# Patient Record
Sex: Male | Born: 1955 | Race: White | Hispanic: No | Smoking: Current every day smoker
Health system: Southern US, Community
[De-identification: ages and names within clinical notes are randomized; demographics above are authoritative.]

## PROBLEM LIST (undated history)

## (undated) DIAGNOSIS — M51369 Other intervertebral disc degeneration, lumbar region without mention of lumbar back pain or lower extremity pain: Secondary | ICD-10-CM

## (undated) DIAGNOSIS — E785 Hyperlipidemia, unspecified: Secondary | ICD-10-CM

## (undated) DIAGNOSIS — H9319 Tinnitus, unspecified ear: Secondary | ICD-10-CM

## (undated) DIAGNOSIS — H9190 Unspecified hearing loss, unspecified ear: Secondary | ICD-10-CM

## (undated) DIAGNOSIS — M5136 Other intervertebral disc degeneration, lumbar region: Secondary | ICD-10-CM

## (undated) DIAGNOSIS — F17200 Nicotine dependence, unspecified, uncomplicated: Secondary | ICD-10-CM

## (undated) DIAGNOSIS — I639 Cerebral infarction, unspecified: Secondary | ICD-10-CM

## (undated) DIAGNOSIS — I10 Essential (primary) hypertension: Secondary | ICD-10-CM

## (undated) HISTORY — PX: PYLOROPLASTY: SHX418

## (undated) HISTORY — PX: WISDOM TOOTH EXTRACTION: SHX21

## (undated) HISTORY — DX: Other intervertebral disc degeneration, lumbar region: M51.36

## (undated) HISTORY — DX: Essential (primary) hypertension: I10

## (undated) HISTORY — PX: HAND SURGERY: SHX662

## (undated) HISTORY — DX: Hyperlipidemia, unspecified: E78.5

## (undated) HISTORY — DX: Other intervertebral disc degeneration, lumbar region without mention of lumbar back pain or lower extremity pain: M51.369

## (undated) HISTORY — DX: Nicotine dependence, unspecified, uncomplicated: F17.200

## (undated) HISTORY — PX: COLONOSCOPY W/ POLYPECTOMY: SHX1380

## (undated) HISTORY — PX: TONSILLECTOMY: SUR1361

---

## 2009-03-28 ENCOUNTER — Emergency Department (HOSPITAL_COMMUNITY): Admission: EM | Admit: 2009-03-28 | Discharge: 2009-03-28 | Payer: Self-pay | Admitting: Emergency Medicine

## 2010-01-06 ENCOUNTER — Ambulatory Visit (HOSPITAL_COMMUNITY)
Admission: RE | Admit: 2010-01-06 | Discharge: 2010-01-06 | Payer: Self-pay | Admitting: Physical Medicine & Rehabilitation

## 2010-12-24 ENCOUNTER — Other Ambulatory Visit: Payer: Self-pay | Admitting: Family Medicine

## 2010-12-24 DIAGNOSIS — N644 Mastodynia: Secondary | ICD-10-CM

## 2010-12-27 ENCOUNTER — Ambulatory Visit
Admission: RE | Admit: 2010-12-27 | Discharge: 2010-12-27 | Disposition: A | Discharge status: Home / Self Care | Payer: PRIVATE HEALTH INSURANCE | Source: Ambulatory Visit | Attending: Family Medicine | Admitting: Family Medicine

## 2010-12-27 DIAGNOSIS — N644 Mastodynia: Secondary | ICD-10-CM

## 2011-07-21 ENCOUNTER — Other Ambulatory Visit: Payer: Self-pay | Admitting: Internal Medicine

## 2011-07-21 ENCOUNTER — Ambulatory Visit
Admission: RE | Admit: 2011-07-21 | Discharge: 2011-07-21 | Disposition: A | Discharge status: Home / Self Care | Payer: PRIVATE HEALTH INSURANCE | Source: Ambulatory Visit | Attending: Internal Medicine | Admitting: Internal Medicine

## 2011-07-21 ENCOUNTER — Ambulatory Visit
Admission: RE | Admit: 2011-07-21 | Discharge: 2011-07-21 | Disposition: A | Discharge status: Home / Self Care | Payer: Worker's Compensation | Source: Ambulatory Visit | Attending: Internal Medicine | Admitting: Internal Medicine

## 2011-07-21 DIAGNOSIS — T148XXA Other injury of unspecified body region, initial encounter: Secondary | ICD-10-CM

## 2012-07-28 ENCOUNTER — Telehealth: Payer: Self-pay

## 2012-07-28 NOTE — Telephone Encounter (Signed)
PT WOULD LIKE [place] TO MAIL HIM A COPY OF HIS RECORDS WHEN HE WAS SEEN ON W/C. PLEASE CALL [phone] TO LET HIM KNOW WHEN WE PUT IT IN THE MAIL PT WAS ALSO TOLD IT COULD TAKE UP TO 72HRS.

## 2012-07-28 NOTE — Telephone Encounter (Signed)
Printed out phone message due to patient only having a paper chart. °

## 2013-04-07 ENCOUNTER — Other Ambulatory Visit: Payer: Self-pay | Admitting: Physician Assistant

## 2013-04-07 NOTE — Telephone Encounter (Signed)
Medication refilled per protocol.Patient needs to be seen before any further refills.  Called pt left mess to schedule appt

## 2013-04-08 ENCOUNTER — Other Ambulatory Visit: Payer: Self-pay | Admitting: Family Medicine

## 2013-04-08 DIAGNOSIS — Z125 Encounter for screening for malignant neoplasm of prostate: Secondary | ICD-10-CM

## 2013-04-08 DIAGNOSIS — E785 Hyperlipidemia, unspecified: Secondary | ICD-10-CM

## 2013-04-08 DIAGNOSIS — Z79899 Other long term (current) drug therapy: Secondary | ICD-10-CM

## 2013-04-11 ENCOUNTER — Other Ambulatory Visit (INDEPENDENT_AMBULATORY_CARE_PROVIDER_SITE_OTHER): Payer: 59

## 2013-04-11 DIAGNOSIS — Z79899 Other long term (current) drug therapy: Secondary | ICD-10-CM

## 2013-04-11 DIAGNOSIS — Z125 Encounter for screening for malignant neoplasm of prostate: Secondary | ICD-10-CM

## 2013-04-11 DIAGNOSIS — E785 Hyperlipidemia, unspecified: Secondary | ICD-10-CM

## 2013-04-11 LAB — COMPLETE METABOLIC PANEL WITH GFR
ALT: 20 U/L (ref 0–53)
AST: 20 U/L (ref 0–37)
CO2: 26 mEq/L (ref 19–32)
Chloride: 105 mEq/L (ref 96–112)
Creat: 0.9 mg/dL (ref 0.50–1.35)
GFR, Est African American: >89 mL/min
GFR, Est Non African American: >89 mL/min
Potassium: 5.4 mEq/L — ABNORMAL HIGH (ref 3.5–5.3)
Total Bilirubin: 0.7 mg/dL (ref 0.3–1.2)

## 2013-04-11 LAB — CBC WITH DIFFERENTIAL/PLATELET
Eosinophils Relative: 5 % (ref 0–5)
HCT: 46.5 % (ref 39.0–52.0)
MCH: 34.3 pg — ABNORMAL HIGH (ref 26.0–34.0)
MCHC: 34.6 g/dL (ref 30.0–36.0)
MCV: 98.9 fL (ref 78.0–100.0)
RBC: 4.7 MIL/uL (ref 4.22–5.81)
RDW: 13.6 % (ref 11.5–15.5)

## 2013-04-11 LAB — LIPID PANEL
Total CHOL/HDL Ratio: 2.8 Ratio
Triglycerides: 92 mg/dL (ref <150)
VLDL: 18 mg/dL (ref 0–40)

## 2013-04-18 ENCOUNTER — Encounter: Payer: Self-pay | Admitting: Family Medicine

## 2013-04-18 ENCOUNTER — Ambulatory Visit (INDEPENDENT_AMBULATORY_CARE_PROVIDER_SITE_OTHER): Payer: 59 | Admitting: Family Medicine

## 2013-04-18 VITALS — BP 110/72 | HR 78 | Temp 98.0°F | Resp 16 | Wt 158.0 lb

## 2013-04-18 DIAGNOSIS — M5136 Other intervertebral disc degeneration, lumbar region: Secondary | ICD-10-CM | POA: Insufficient documentation

## 2013-04-18 DIAGNOSIS — F17200 Nicotine dependence, unspecified, uncomplicated: Secondary | ICD-10-CM | POA: Insufficient documentation

## 2013-04-18 DIAGNOSIS — E785 Hyperlipidemia, unspecified: Secondary | ICD-10-CM

## 2013-04-18 MED ORDER — MELOXICAM 15 MG PO TABS: 15.0000 mg | ORAL_TABLET | Freq: Every day | ORAL | Status: DC

## 2013-04-18 MED ORDER — SIMVASTATIN 40 MG PO TABS: 40.0000 mg | ORAL_TABLET | Freq: Every day | ORAL | Status: DC

## 2013-04-18 NOTE — Progress Notes (Signed)
Subjective:    Patient ID: Jacob Yoder, male    DOB: 1956/07/08, 57 y.o.   MRN: [id number]  HPI Patient is a smoker and is here to followup on his hyperlipidemia. He is currently taking simvastatin 40 mg by mouth daily. He is a significant family history of coronary artery disease. His goal LDL is less than 130 due to his elevated HDL.  He denies myalgia or right upper quadrant pain. He states he still has occasional anal leakage flatulence and occasional diarrhea. His last colonoscopy was in 2007. He declines a colonoscopy at the present time to evaluate this further.  Appointment on 04/11/2013  Component Date Value Range Status  . Sodium 04/11/2013 139  135 - 145 mEq/L Final  . Potassium 04/11/2013 5.4* 3.5 - 5.3 mEq/L Final   No visible hemolysis.  . Chloride 04/11/2013 105  96 - 112 mEq/L Final  . CO2 04/11/2013 26  19 - 32 mEq/L Final  . Glucose, Bld 04/11/2013 104* 70 - 99 mg/dL Final  . BUN 08/65/7846 26* 6 - 23 mg/dL Final  . Creat 96/29/5284 0.90  0.50 - 1.35 mg/dL Final  . Total Bilirubin 04/11/2013 0.7  0.3 - 1.2 mg/dL Final  . Alkaline Phosphatase 04/11/2013 69  39 - 117 U/L Final  . AST 04/11/2013 20  0 - 37 U/L Final  . ALT 04/11/2013 20  0 - 53 U/L Final  . Total Protein 04/11/2013 7.1  6.0 - 8.3 g/dL Final  . Albumin 13/24/4010 4.8  3.5 - 5.2 g/dL Final  . Calcium 27/25/3664 10.1  8.4 - 10.5 mg/dL Final  . GFR, Est African American 04/11/2013 >89   Final  . GFR, Est Non African American 04/11/2013 >89   Final   Comment:                            The estimated GFR is a calculation valid for adults (>=2 years old)                          that uses the CKD-EPI algorithm to adjust for age and sex. It is                            not to be used for children, pregnant women, hospitalized patients,                             patients on dialysis, or with rapidly changing kidney function.                          According to the NKDEP, eGFR >89 is normal, 60-89 shows  mild                          impairment, 30-59 shows moderate impairment, 15-29 shows severe                          impairment and <15 is ESRD.                             Marland Kitchen Cholesterol 04/11/2013 190  0 - 200 mg/dL Final   Comment: ATP III Classification:                                <  200        mg/dL        Desirable                               200 - 239     mg/dL        Borderline High                               >= 240        mg/dL        High                             . Triglycerides 04/11/2013 92  <150 mg/dL Final  . HDL 16/08/9603 69  >39 mg/dL Final  . Total CHOL/HDL Ratio 04/11/2013 2.8   Final  . VLDL 04/11/2013 18  0 - 40 mg/dL Final  . LDL Cholesterol 04/11/2013 103* 0 - 99 mg/dL Final   Comment:                            Total Cholesterol/HDL Ratio:CHD Risk                                                 Coronary Heart Disease Risk Table                                                                 Men       Women                                   1/2 Average Risk              3.4        3.3                                       Average Risk              5.0        4.4                                    2X Average Risk              9.6        7.1                                    3X Average Risk             23.4       11.0  Use the calculated Patient Ratio above and the CHD Risk table                           to determine the patient's CHD Risk.                          ATP III Classification (LDL):                                < 100        mg/dL         Optimal                               100 - 129     mg/dL         Near or Above Optimal                               130 - 159     mg/dL         Borderline High                               160 - 189     mg/dL         High                                > 190        mg/dL         Very High                             . WBC 04/11/2013 6.6  4.0 - 10.5 K/uL Final  . RBC  04/11/2013 4.70  4.22 - 5.81 MIL/uL Final  . Hemoglobin 04/11/2013 16.1  13.0 - 17.0 g/dL Final  . HCT 40/98/1191 46.5  39.0 - 52.0 % Final  . MCV 04/11/2013 98.9  78.0 - 100.0 fL Final  . MCH 04/11/2013 34.3* 26.0 - 34.0 pg Final  . MCHC 04/11/2013 34.6  30.0 - 36.0 g/dL Final  . RDW 47/82/9562 13.6  11.5 - 15.5 % Final  . Platelets 04/11/2013 259  150 - 400 K/uL Final  . Neutrophils Relative % 04/11/2013 49  43 - 77 % Final  . Neutro Abs 04/11/2013 3.2  1.7 - 7.7 K/uL Final  . Lymphocytes Relative 04/11/2013 36  12 - 46 % Final  . Lymphs Abs 04/11/2013 2.4  0.7 - 4.0 K/uL Final  . Monocytes Relative 04/11/2013 9  3 - 12 % Final  . Monocytes Absolute 04/11/2013 0.6  0.1 - 1.0 K/uL Final  . Eosinophils Relative 04/11/2013 5  0 - 5 % Final  . Eosinophils Absolute 04/11/2013 0.3  0.0 - 0.7 K/uL Final  . Basophils Relative 04/11/2013 1  0 - 1 % Final  . Basophils Absolute 04/11/2013 0.1  0.0 - 0.1 K/uL Final  . Smear Review 04/11/2013 Criteria for review not met   Final  . PSA 04/11/2013 1.15  <=4.00 ng/mL Final   Comment: Test Methodology: ECLIA PSA (Electrochemiluminescence Immunoassay)  For PSA values from 2.5-4.0, particularly in younger men <60 years                          old, the AUA and NCCN suggest testing for % Free PSA (3515) and                          evaluation of the rate of increase in PSA (PSA velocity).   Past Medical History  Diagnosis Date  . Hyperlipidemia   . DDD (degenerative disc disease), lumbar   . Smoker    No current outpatient prescriptions on file prior to visit.   No current facility-administered medications on file prior to visit.   No Known Allergies History   Social History  . Marital Status: Divorced    Spouse Name: N/A    Number of Children: N/A  . Years of Education: N/A   Occupational History  . Not on file.   Social History Main Topics  . Smoking status: Current Every Day Smoker  -- 1.00 packs/day    Types: Cigarettes  . Smokeless tobacco: Not on file  . Alcohol Use: 1.0 oz/week    2 drink(s) per week  . Drug Use: No  . Sexually Active: Not on file   Other Topics Concern  . Not on file   Social History Narrative  . No narrative on file   Family History  Problem Relation Age of Onset  . Heart disease Father   . Heart disease Paternal Grandfather      Review of Systems  All other systems reviewed and are negative.       Objective:   Physical Exam  Vitals reviewed. Constitutional: He appears well-developed and well-nourished.  Neck: Neck supple. No thyromegaly present.  Cardiovascular: Normal rate, regular rhythm and normal heart sounds.  Exam reveals no gallop.   No murmur heard. Pulmonary/Chest: Effort normal and breath sounds normal. No respiratory distress. He has no wheezes. He has no rales.  Abdominal: Soft. Bowel sounds are normal. He exhibits no distension. There is no tenderness. There is no rebound.  Lymphadenopathy:    He has no cervical adenopathy.          Assessment & Plan:  1. HLD (hyperlipidemia) Decrease Zocor to 20 mg by mouth daily. Recheck CMP and fasting lipid panel in 6 months. I encouraged smoking cessation. I recommended colonoscopy but the patient declines at the present time. I refill his meloxicam which he takes for his degenerative disc disease in lumbar spine. He is aware of the possible risk of ulcers as well as the increased risk of cardiovascular disease.

## 2013-08-01 ENCOUNTER — Ambulatory Visit (INDEPENDENT_AMBULATORY_CARE_PROVIDER_SITE_OTHER): Payer: 59 | Admitting: Physician Assistant

## 2013-08-01 ENCOUNTER — Encounter: Payer: Self-pay | Admitting: Physician Assistant

## 2013-08-01 VITALS — BP 114/70 | HR 88 | Temp 98.2°F | Resp 20 | Ht 67.0 in | Wt 159.0 lb

## 2013-08-01 DIAGNOSIS — F17200 Nicotine dependence, unspecified, uncomplicated: Secondary | ICD-10-CM

## 2013-08-01 DIAGNOSIS — J988 Other specified respiratory disorders: Secondary | ICD-10-CM

## 2013-08-01 DIAGNOSIS — A499 Bacterial infection, unspecified: Secondary | ICD-10-CM

## 2013-08-01 MED ORDER — AZITHROMYCIN 250 MG PO TABS: ORAL_TABLET | ORAL | Status: DC

## 2013-08-01 NOTE — Progress Notes (Signed)
Patient ID: Jacob Yoder MRN: [id number], DOB: [birth year], 57 y.o. Date of Encounter: 08/01/2013, 8:57 AM    Chief Complaint:  Chief Complaint  Patient presents with  . sinus congestion, HA, cough     HPI: 57 y.o. year old white male reports that he started getting sick about 5 days ago at which time he had sneezing. Since then he has had times when his nose is runny and other times where it is stopped up and congested. Feels pressure in his sinus areas. Also has chest congestion and cough with thick phlegm. Felt much worse yesterday in general. Has had no documented fever and no chills.  Smokes one pack per day. Says he brought some patches but hasn't quite made up his mind to stop smoking and use the patches.     Home Meds: See attached medication section for any medications that were entered at today's visit. The computer does not put those onto this list.The following list is a list of meds entered prior to today's visit.   Current Outpatient Prescriptions on File Prior to Visit  Medication Sig Dispense Refill  . aspirin 81 MG tablet Take 81 mg by mouth daily.      . meloxicam (MOBIC) 15 MG tablet Take 1 tablet (15 mg total) by mouth daily.  90 tablet  3  . simvastatin (ZOCOR) 40 MG tablet Take 1 tablet (40 mg total) by mouth at bedtime.  90 tablet  3  . cholecalciferol (VITAMIN D) 1000 UNITS tablet Take 1,000 Units by mouth daily. 1/2 tab po qd       No current facility-administered medications on file prior to visit.    Allergies: No Known Allergies    Review of Systems: See HPI for pertinent ROS. All other ROS negative.    Physical Exam: Blood pressure 114/70, pulse 88, temperature 98.2 F (36.8 C), temperature source Oral, resp. rate 20, height 5\' 7"  (1.702 m), weight 159 lb (72.122 kg)., Body mass index is 24.9 kg/(m^2). General: Well-nourished well-developed white male. Appears in no acute distress. HEENT: Normocephalic, atraumatic, eyes without discharge, sclera  non-icteric, nares are without discharge. Bilateral auditory canals clear, TM's are without perforation, pearly grey and translucent with reflective cone of light bilaterally. Oral cavity moist, posterior pharynx without exudate, erythema, peritonsillar abscess. No sinus tenderness with percussion.  Neck: Supple. No thyromegaly. No lymphadenopathy. Lungs: Clear bilaterally to auscultation without wheezes, rales, or rhonchi. Breathing is unlabored. Clear. I hear no wheezes or rhonchi. Heart: Regular rhythm. No murmurs, rubs, or gallops. Msk:  Strength and tone normal for age. Extremities/Skin: Warm and dry. No rashes or suspicious lesions. Neuro: Alert and oriented X 3. Moves all extremities spontaneously. Gait is normal. CNII-XII grossly in tact. Psych:  Responds to questions appropriately with a normal affect.     ASSESSMENT AND PLAN:  57 y.o. year old male with  1. Bacterial respiratory infection Also use Mucinex DM and drink lots of clear liquids. - azithromycin (ZITHROMAX) 250 MG tablet; Day 1: Take 2 daily.  Days 2-5: Take 1 daily  Dispense: 6 tablet; Refill: 0 Complete all of the antibiotic. Use Mucinex DM and liquids as above. Follow up if symptoms worsen or do not resolve.  2. Smoker Discussed Chantix. Patient is not want to use this prescription. Concerned about adverse effects though I tried to reassure him regarding these. Encouraged him to become motivated to quit and to use the patches or other aids to assist this.   Signed, Shon Hale  [place], [place], [facility] 08/01/2013 8:57 AM

## 2013-08-19 ENCOUNTER — Telehealth: Payer: Self-pay | Admitting: Family Medicine

## 2013-08-19 MED ORDER — MELOXICAM 15 MG PO TABS: 15.0000 mg | ORAL_TABLET | Freq: Every day | ORAL | Status: DC

## 2013-08-19 NOTE — Telephone Encounter (Signed)
Patient needs his Mobic  Refilled.  [facility] home delivery

## 2013-08-19 NOTE — Addendum Note (Signed)
Addended by: Donne Anon on: 08/19/2013 02:17 PM   Modules accepted: Orders

## 2013-10-18 ENCOUNTER — Other Ambulatory Visit: Payer: 59

## 2013-10-18 DIAGNOSIS — Z79899 Other long term (current) drug therapy: Secondary | ICD-10-CM

## 2013-10-18 DIAGNOSIS — E785 Hyperlipidemia, unspecified: Secondary | ICD-10-CM

## 2013-10-18 LAB — COMPLETE METABOLIC PANEL WITH GFR
ALT: 16 U/L (ref 0–53)
AST: 18 U/L (ref 0–37)
Albumin: 4.4 g/dL (ref 3.5–5.2)
Alkaline Phosphatase: 65 U/L (ref 39–117)
Calcium: 9.7 mg/dL (ref 8.4–10.5)
Creat: 0.88 mg/dL (ref 0.50–1.35)
Sodium: 137 mEq/L (ref 135–145)

## 2013-10-18 LAB — LIPID PANEL
Cholesterol: 172 mg/dL (ref 0–200)
Total CHOL/HDL Ratio: 2.8 Ratio
Triglycerides: 122 mg/dL (ref <150)

## 2013-10-24 ENCOUNTER — Encounter: Payer: Self-pay | Admitting: Family Medicine

## 2013-11-02 ENCOUNTER — Other Ambulatory Visit: Payer: Self-pay | Admitting: Family Medicine

## 2013-11-02 MED ORDER — MELOXICAM 15 MG PO TABS: 15.0000 mg | ORAL_TABLET | Freq: Every day | ORAL | Status: DC

## 2013-11-02 MED ORDER — SIMVASTATIN 40 MG PO TABS: 40.0000 mg | ORAL_TABLET | Freq: Every day | ORAL | Status: DC

## 2014-05-22 ENCOUNTER — Ambulatory Visit (INDEPENDENT_AMBULATORY_CARE_PROVIDER_SITE_OTHER): Payer: 59 | Admitting: Family Medicine

## 2014-05-22 ENCOUNTER — Encounter: Payer: Self-pay | Admitting: Family Medicine

## 2014-05-22 VITALS — BP 110/88 | HR 76 | Temp 97.3°F | Resp 18 | Ht 68.0 in | Wt 160.0 lb

## 2014-05-22 DIAGNOSIS — E785 Hyperlipidemia, unspecified: Secondary | ICD-10-CM

## 2014-05-22 DIAGNOSIS — IMO0001 Reserved for inherently not codable concepts without codable children: Secondary | ICD-10-CM

## 2014-05-22 DIAGNOSIS — M25512 Pain in left shoulder: Secondary | ICD-10-CM

## 2014-05-22 DIAGNOSIS — M25519 Pain in unspecified shoulder: Secondary | ICD-10-CM

## 2014-05-22 LAB — COMPLETE METABOLIC PANEL WITH GFR
ALBUMIN: 4.6 g/dL (ref 3.5–5.2)
ALK PHOS: 65 U/L (ref 39–117)
ALT: 17 U/L (ref 0–53)
AST: 19 U/L (ref 0–37)
BUN: 23 mg/dL (ref 6–23)
CALCIUM: 9.6 mg/dL (ref 8.4–10.5)
CHLORIDE: 104 meq/L (ref 96–112)
CO2: 26 mEq/L (ref 19–32)
CREATININE: 0.84 mg/dL (ref 0.50–1.35)
Glucose, Bld: 105 mg/dL — ABNORMAL HIGH (ref 70–99)
Potassium: 4.8 mEq/L (ref 3.5–5.3)
Sodium: 139 mEq/L (ref 135–145)
TOTAL PROTEIN: 7.1 g/dL (ref 6.0–8.3)
Total Bilirubin: 0.6 mg/dL (ref 0.2–1.2)

## 2014-05-22 LAB — SEDIMENTATION RATE: SED RATE: 1 mm/h (ref 0–16)

## 2014-05-22 LAB — LIPID PANEL
CHOL/HDL RATIO: 3 ratio
Cholesterol: 202 mg/dL — ABNORMAL HIGH (ref 0–200)
HDL: 68 mg/dL (ref >39)
LDL CALC: 115 mg/dL — AB (ref 0–99)
Triglycerides: 95 mg/dL (ref <150)
VLDL: 19 mg/dL (ref 0–40)

## 2014-05-22 LAB — CK: Total CK: 109 U/L (ref 7–232)

## 2014-05-22 NOTE — Progress Notes (Signed)
Subjective:    Patient ID: Jacob Yoder, male    DOB: Jun 19, 1956, 58 y.o.   MRN: [id number]  HPI Patient is a very pleasant 58 year old white male with a past medical history of hyperlipidemia and smoking who presents today 59-6 weeks of intermittent bilateral shoulder pain. Patient states that he will develop intense pain radiating from both shoulders down to the level of both elbows. Pain lasts 1-2 minutes and then resolves spontaneously on its own.  There are no initiating event. He denies any alleviating factors. He denies any exacerbating factors. He denies any neck pain although he does have a history of cervical degenerative disc disease per his report.  He has no pain at all with range of motion in either shoulder. Overhead activity does not trigger the pain. He has fairly painless range of motion in both shoulders today. He has a negative empty can sign bilaterally. He has a negative Hawkins maneuver bilaterally. He has full and painless abduction of the shoulder against resistance but no decreased strength. He denies any numbness or tingling in his arms. He denies any numbness and tingling in his hands. He denies any weakness in grip strength. He denies any chest pain or shortness of breath or dyspnea on exertion. The pain is not triggered by exercise or activity.  He does have a past medical history of myalgias due to Lipitor. Past Medical History  Diagnosis Date  . Hyperlipidemia   . DDD (degenerative disc disease), lumbar   . Smoker    No past surgical history on file. Current Outpatient Prescriptions on File Prior to Visit  Medication Sig Dispense Refill  . aspirin 81 MG tablet Take 81 mg by mouth daily.      . meloxicam (MOBIC) 15 MG tablet Take 1 tablet (15 mg total) by mouth daily.  360 tablet  0  . Multiple Vitamin (MULTIVITAMIN) tablet Take 1 tablet by mouth daily.      . simvastatin (ZOCOR) 40 MG tablet Take 1 tablet (40 mg total) by mouth at bedtime.  360 tablet  0   No  current facility-administered medications on file prior to visit.   No Known Allergies History   Social History  . Marital Status: Divorced    Spouse Name: N/A    Number of Children: N/A  . Years of Education: N/A   Occupational History  . Not on file.   Social History Main Topics  . Smoking status: Current Every Day Smoker -- 1.00 packs/day    Types: Cigarettes  . Smokeless tobacco: Not on file  . Alcohol Use: 1.0 oz/week    2 drink(s) per week  . Drug Use: No  . Sexual Activity: Not on file   Other Topics Concern  . Not on file   Social History Narrative  . No narrative on file      Review of Systems  All other systems reviewed and are negative.      Objective:   Physical Exam  Vitals reviewed. Constitutional: He is oriented to person, place, and time.  Cardiovascular: Normal rate, regular rhythm and normal heart sounds.   Pulmonary/Chest: Effort normal and breath sounds normal. No respiratory distress. He has no wheezes. He has no rales.  Abdominal: Soft. Bowel sounds are normal. He exhibits no distension. There is no tenderness. There is no rebound.  Musculoskeletal: Normal range of motion.  Neurological: He is alert and oriented to person, place, and time. He has normal reflexes. He displays normal reflexes. No  cranial nerve deficit. He exhibits normal muscle tone. Coordination normal.          Assessment & Plan:  Left shoulder pain  Myalgia and myositis - Plan: CK, Sedimentation rate  HLD (hyperlipidemia) - Plan: COMPLETE METABOLIC PANEL WITH GFR, Lipid panel   Differential diagnosis includes cervical spinal stenosis, bilateral shoulder bursitis, myalgias due to statin, myalgias due to statins.  Based on his history and exam I believe myalgias due to statins are the most likely cause. I have asked the patient to temporarily discontinue simvastatin for 3 weeks and see if symptoms improve. Recheck in 3 weeks. Also obtain a CK, sedimentation rate, CMP,  and fasting lipid panel.

## 2014-06-12 ENCOUNTER — Telehealth: Payer: Self-pay | Admitting: Family Medicine

## 2014-06-12 ENCOUNTER — Other Ambulatory Visit: Payer: 59

## 2014-06-12 DIAGNOSIS — Z79899 Other long term (current) drug therapy: Secondary | ICD-10-CM

## 2014-06-12 DIAGNOSIS — E785 Hyperlipidemia, unspecified: Secondary | ICD-10-CM

## 2014-06-12 LAB — SEDIMENTATION RATE: Sed Rate: 1 mm/hr (ref 0–16)

## 2014-06-12 LAB — CK: CK TOTAL: 101 U/L (ref 7–232)

## 2014-06-12 LAB — LIPID PANEL
Cholesterol: 266 mg/dL — ABNORMAL HIGH (ref 0–200)
HDL: 60 mg/dL (ref >39)
LDL CALC: 172 mg/dL — AB (ref 0–99)
TRIGLYCERIDES: 168 mg/dL — AB (ref <150)
Total CHOL/HDL Ratio: 4.4 Ratio
VLDL: 34 mg/dL (ref 0–40)

## 2014-06-12 LAB — COMPLETE METABOLIC PANEL WITH GFR
ALBUMIN: 4.5 g/dL (ref 3.5–5.2)
ALK PHOS: 71 U/L (ref 39–117)
ALT: 14 U/L (ref 0–53)
AST: 17 U/L (ref 0–37)
BILIRUBIN TOTAL: 0.6 mg/dL (ref 0.2–1.2)
BUN: 28 mg/dL — AB (ref 6–23)
CALCIUM: 9.8 mg/dL (ref 8.4–10.5)
CHLORIDE: 105 meq/L (ref 96–112)
CO2: 27 mEq/L (ref 19–32)
CREATININE: 0.8 mg/dL (ref 0.50–1.35)
GLUCOSE: 101 mg/dL — AB (ref 70–99)
Potassium: 4.9 mEq/L (ref 3.5–5.3)
Sodium: 139 mEq/L (ref 135–145)
Total Protein: 6.8 g/dL (ref 6.0–8.3)

## 2014-06-12 LAB — CBC WITH DIFFERENTIAL/PLATELET
BASOS PCT: 1 % (ref 0–1)
Basophils Absolute: 0.1 10*3/uL (ref 0.0–0.1)
EOS ABS: 0.3 10*3/uL (ref 0.0–0.7)
Eosinophils Relative: 4 % (ref 0–5)
HEMATOCRIT: 45.2 % (ref 39.0–52.0)
HEMOGLOBIN: 16.1 g/dL (ref 13.0–17.0)
LYMPHS ABS: 2.1 10*3/uL (ref 0.7–4.0)
LYMPHS PCT: 33 % (ref 12–46)
MCH: 35.2 pg — ABNORMAL HIGH (ref 26.0–34.0)
MCHC: 35.6 g/dL (ref 30.0–36.0)
MCV: 98.9 fL (ref 78.0–100.0)
MONO ABS: 0.4 10*3/uL (ref 0.1–1.0)
Monocytes Relative: 7 % (ref 3–12)
Neutro Abs: 3.5 10*3/uL (ref 1.7–7.7)
Neutrophils Relative %: 55 % (ref 43–77)
PLATELETS: 264 10*3/uL (ref 150–400)
RBC: 4.57 MIL/uL (ref 4.22–5.81)
RDW: 13.7 % (ref 11.5–15.5)
WBC: 6.3 10*3/uL (ref 4.0–10.5)

## 2014-06-12 NOTE — Telephone Encounter (Signed)
MD to be made aware.  

## 2014-06-12 NOTE — Telephone Encounter (Signed)
[phone]   PT wanted to let Dr Dennard Schaumann know that he did have pain once between shoulder and elbow

## 2014-06-12 NOTE — Telephone Encounter (Signed)
Did the symptoms improve off lipitor?  If not, I would schedule for MRI of c-spine.

## 2014-06-12 NOTE — Telephone Encounter (Signed)
Call placed to patient. LMTRC.  

## 2014-06-12 NOTE — Telephone Encounter (Signed)
Patient in office for labs.   States that he had one episode of pain about 2 days after last visit, but has not experienced any more.   Advised that if pain resumes to contact our office for MRI.

## 2014-06-16 ENCOUNTER — Telehealth: Payer: Self-pay | Admitting: Family Medicine

## 2014-06-17 ENCOUNTER — Other Ambulatory Visit: Payer: Self-pay | Admitting: *Deleted

## 2014-07-03 ENCOUNTER — Ambulatory Visit (INDEPENDENT_AMBULATORY_CARE_PROVIDER_SITE_OTHER): Payer: 59 | Admitting: Physician Assistant

## 2014-07-03 ENCOUNTER — Encounter: Payer: Self-pay | Admitting: Physician Assistant

## 2014-07-03 ENCOUNTER — Ambulatory Visit
Admission: RE | Admit: 2014-07-03 | Discharge: 2014-07-03 | Disposition: A | Discharge status: Home / Self Care | Payer: 59 | Source: Ambulatory Visit | Attending: Physician Assistant | Admitting: Physician Assistant

## 2014-07-03 VITALS — BP 128/82 | HR 96 | Temp 98.1°F | Resp 18 | Wt 160.0 lb

## 2014-07-03 DIAGNOSIS — M546 Pain in thoracic spine: Secondary | ICD-10-CM

## 2014-07-03 MED ORDER — METAXALONE 800 MG PO TABS: 800.0000 mg | ORAL_TABLET | Freq: Three times a day (TID) | ORAL | Status: DC

## 2014-07-03 MED ORDER — TRAMADOL HCL 50 MG PO TABS: ORAL_TABLET | ORAL | Status: DC

## 2014-07-03 NOTE — Progress Notes (Signed)
Patient ID: GRAYSON PFEFFERLE MRN: [id number], DOB: Jul 18, 1956, 58 y.o. Date of Encounter: 07/03/2014, 9:37 AM    Chief Complaint:  Chief Complaint  Patient presents with  . Back Pain    was moving Saturday furniture and hurt back     HPI: 58 y.o. year old white male says that he was helping his girlfriend move into a new house on Saturday 07/01/14. Since then he has been having significant pain between his shoulder blades. He is having no pain numbness or tingling down his arms. Says that he did not lift any particular item that was severely heavy--says there were just a lot of boxes etc. Says he did have to carry them up 4 flights of stairs.  Says that his prior history has only included low back pain and has never had problems with this area of his back before.  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 Says he works 4 days a week 10 hours. Supposed to be working Monday through Thursday this week.     Home Meds:   Outpatient Prescriptions Prior to Visit  Medication Sig Dispense Refill  . aspirin 81 MG tablet Take 81 mg by mouth daily.      . meloxicam (MOBIC) 15 MG tablet Take 1 tablet (15 mg total) by mouth daily.  360 tablet  0  . Multiple Vitamin (MULTIVITAMIN) tablet Take 1 tablet by mouth daily.      . simvastatin (ZOCOR) 40 MG tablet Take 1 tablet (40 mg total) by mouth at bedtime.  360 tablet  0   No facility-administered medications prior to visit.    Allergies: No Known Allergies    Review of Systems: See HPI for pertinent ROS. All other ROS negative.    Physical Exam: Blood pressure 128/82, pulse 96, temperature 98.1 F (36.7 C), temperature source Oral, resp. rate 18, weight 160 lb (72.576 kg)., Body mass index is 24.33 kg/(m^2). General:  WNWD Appears in no acute distress however is adjusting his posture and grunting throughout visit.  Neck: Supple. No thyromegaly. No lymphadenopathy. Lungs: Clear  bilaterally to auscultation without wheezes, rales, or rhonchi. Breathing is unlabored. Heart: Regular rhythm. No murmurs, rubs, or gallops. Msk:  Strength and tone normal for age. He has severe pain with palpation of the muscles between the scapula area. This is bilaterally. He does not have one area of focal increased pain but instead diffuse bilaterally. Extremities/Skin: normal. Neuro: Alert and oriented X 3. Moves all extremities spontaneously. Gait is normal. CNII-XII grossly in tact. Psych:  Responds to questions appropriately with a normal affect.     ASSESSMENT AND PLAN:  58 y.o. year old male with  1. Bilateral thoracic back pain  - DG Thoracic Spine W/Swimmers; Future --Skelaxin 800mg  one po QID prn --Tramadol 50mg   1-2 Q 6 hours prn --Note oow today and tomorrow. To start with he just wants a note out of work for today and tomorrow. Says that tomorrow afternoon if he is still in significant pain he will call us for further management.  Discussed using some hydrocodone especially on these days when he is out of work but he defers. Discussed with the Skelaxin may cause drowsiness and if that is the case then to just use it at night. Otherwise if his dosing is not limited because of drowsiness, then he needs to take this 3-4 times a day routinely. Also apply heat to the area  including heating pad or warm water from the shower. He is to go to pick up his medications now and get his x-ray now. We will contact him with the x-ray results. Otherwise he is to follow up with me if pain not to a controlled level in 2 days.   [address] [place], [place], [facility] 07/03/2014 9:37 AM

## 2014-10-06 ENCOUNTER — Ambulatory Visit (INDEPENDENT_AMBULATORY_CARE_PROVIDER_SITE_OTHER): Payer: 59 | Admitting: Family Medicine

## 2014-10-06 ENCOUNTER — Encounter: Payer: Self-pay | Admitting: Family Medicine

## 2014-10-06 VITALS — BP 130/70 | HR 76 | Temp 98.0°F | Resp 18 | Ht 67.0 in | Wt 165.0 lb

## 2014-10-06 DIAGNOSIS — D239 Other benign neoplasm of skin, unspecified: Secondary | ICD-10-CM

## 2014-10-06 DIAGNOSIS — J208 Acute bronchitis due to other specified organisms: Secondary | ICD-10-CM

## 2014-10-06 DIAGNOSIS — L738 Other specified follicular disorders: Secondary | ICD-10-CM

## 2014-10-06 NOTE — Progress Notes (Signed)
   Subjective:    Patient ID: Jacob Yoder, male    DOB: 04-19-1956, 58 y.o.   MRN: [id number]  HPI Patient has a growth over his right eyelid. It is approximately 1-2 mm in size. It is a small yellow papule consistent with a small cyst versus sebaceous hyperplasia. He also has 2 growths on the back of his right ankle. One is a brown macule consistent with a benign nevus the other is a 5 mm dermatofibroma. He also has a cough productive of clear mucus and postnasal drip that has been going on for 7 days. He denies any fever or shortness of breath Past Medical History  Diagnosis Date  . Hyperlipidemia   . DDD (degenerative disc disease), lumbar   . Smoker    No past surgical history on file. Current Outpatient Prescriptions on File Prior to Visit  Medication Sig Dispense Refill  . aspirin 81 MG tablet Take 81 mg by mouth daily.    . meloxicam (MOBIC) 15 MG tablet Take 1 tablet (15 mg total) by mouth daily. 360 tablet 0  . Multiple Vitamin (MULTIVITAMIN) tablet Take 1 tablet by mouth daily.    . simvastatin (ZOCOR) 40 MG tablet Take 1 tablet (40 mg total) by mouth at bedtime. 360 tablet 0   No current facility-administered medications on file prior to visit.   No Known Allergies History   Social History  . Marital Status: Divorced    Spouse Name: N/A    Number of Children: N/A  . Years of Education: N/A   Occupational History  . Not on file.   Social History Main Topics  . Smoking status: Current Every Day Smoker -- 1.00 packs/day    Types: Cigarettes  . Smokeless tobacco: Not on file  . Alcohol Use: 1.0 oz/week    2 drink(s) per week  . Drug Use: No  . Sexual Activity: Not on file   Other Topics Concern  . Not on file   Social History Narrative      Review of Systems  All other systems reviewed and are negative.      Objective:   Physical Exam  HENT:  Right Ear: External ear normal.  Left Ear: External ear normal.  Nose: Nose normal.  Mouth/Throat:  Oropharynx is clear and moist. No oropharyngeal exudate.  Eyes: Conjunctivae are normal.  Cardiovascular: Normal rate, regular rhythm and normal heart sounds.   Pulmonary/Chest: Effort normal and breath sounds normal. No respiratory distress. He has no wheezes. He has no rales.  Lymphadenopathy:    He has no cervical adenopathy.  Vitals reviewed. see description in HPI        Assessment & Plan:  Dermatofibroma  Sebaceous hyperplasia  Acute bronchitis due to other specified organisms  I reassured the patient that his concerns appear to be a benign nevus, a dermatofibroma, and sebaceous hyperplasia. I would only recommend a biopsy if the lesions change suddenly. The patient also appears to have a viral upper respiratory infection. I recommended tincture of time for supportive care including Sudafed, Zyrtec, and Mucinex.

## 2015-01-19 ENCOUNTER — Other Ambulatory Visit: Payer: 59

## 2015-01-19 DIAGNOSIS — Z0000 Encounter for general adult medical examination without abnormal findings: Secondary | ICD-10-CM

## 2015-01-19 LAB — COMPLETE METABOLIC PANEL WITH GFR
ALBUMIN: 4.4 g/dL (ref 3.5–5.2)
ALT: 22 U/L (ref 0–53)
AST: 20 U/L (ref 0–37)
Alkaline Phosphatase: 64 U/L (ref 39–117)
BUN: 25 mg/dL — ABNORMAL HIGH (ref 6–23)
CALCIUM: 9.6 mg/dL (ref 8.4–10.5)
CHLORIDE: 104 meq/L (ref 96–112)
CO2: 26 meq/L (ref 19–32)
Creat: 0.75 mg/dL (ref 0.50–1.35)
Glucose, Bld: 97 mg/dL (ref 70–99)
Potassium: 5.2 mEq/L (ref 3.5–5.3)
SODIUM: 139 meq/L (ref 135–145)
TOTAL PROTEIN: 7.1 g/dL (ref 6.0–8.3)
Total Bilirubin: 0.6 mg/dL (ref 0.2–1.2)

## 2015-01-19 LAB — CBC WITH DIFFERENTIAL/PLATELET
BASOS PCT: 1 % (ref 0–1)
Basophils Absolute: 0.1 10*3/uL (ref 0.0–0.1)
EOS ABS: 0.2 10*3/uL (ref 0.0–0.7)
EOS PCT: 4 % (ref 0–5)
HEMATOCRIT: 45.8 % (ref 39.0–52.0)
HEMOGLOBIN: 15.6 g/dL (ref 13.0–17.0)
LYMPHS ABS: 2.2 10*3/uL (ref 0.7–4.0)
Lymphocytes Relative: 38 % (ref 12–46)
MCH: 34.1 pg — AB (ref 26.0–34.0)
MCHC: 34.1 g/dL (ref 30.0–36.0)
MCV: 100.2 fL — AB (ref 78.0–100.0)
MONO ABS: 0.4 10*3/uL (ref 0.1–1.0)
MONOS PCT: 7 % (ref 3–12)
MPV: 9.8 fL (ref 8.6–12.4)
Neutro Abs: 3 10*3/uL (ref 1.7–7.7)
Neutrophils Relative %: 50 % (ref 43–77)
Platelets: 258 10*3/uL (ref 150–400)
RBC: 4.57 MIL/uL (ref 4.22–5.81)
RDW: 13.2 % (ref 11.5–15.5)
WBC: 5.9 10*3/uL (ref 4.0–10.5)

## 2015-01-19 LAB — LIPID PANEL
CHOL/HDL RATIO: 3.3 ratio
Cholesterol: 205 mg/dL — ABNORMAL HIGH (ref 0–200)
HDL: 62 mg/dL (ref >=40)
LDL Cholesterol: 126 mg/dL — ABNORMAL HIGH (ref 0–99)
Triglycerides: 86 mg/dL (ref <150)
VLDL: 17 mg/dL (ref 0–40)

## 2015-01-20 LAB — PSA: PSA: 0.97 ng/mL (ref <=4.00)

## 2015-01-26 ENCOUNTER — Encounter: Payer: Self-pay | Admitting: Family Medicine

## 2015-01-26 ENCOUNTER — Ambulatory Visit
Admission: RE | Admit: 2015-01-26 | Discharge: 2015-01-26 | Disposition: A | Discharge status: Home / Self Care | Payer: 59 | Source: Ambulatory Visit | Attending: Family Medicine | Admitting: Family Medicine

## 2015-01-26 ENCOUNTER — Telehealth: Payer: Self-pay | Admitting: Family Medicine

## 2015-01-26 ENCOUNTER — Other Ambulatory Visit: Payer: Self-pay | Admitting: Family Medicine

## 2015-01-26 ENCOUNTER — Ambulatory Visit (INDEPENDENT_AMBULATORY_CARE_PROVIDER_SITE_OTHER): Payer: 59 | Admitting: Family Medicine

## 2015-01-26 VITALS — BP 142/90 | HR 76 | Temp 97.7°F | Resp 18 | Ht 67.0 in | Wt 164.0 lb

## 2015-01-26 DIAGNOSIS — R079 Chest pain, unspecified: Secondary | ICD-10-CM | POA: Diagnosis not present

## 2015-01-26 DIAGNOSIS — Z0000 Encounter for general adult medical examination without abnormal findings: Secondary | ICD-10-CM | POA: Diagnosis not present

## 2015-01-26 MED ORDER — CELECOXIB 200 MG PO CAPS: 200.0000 mg | ORAL_CAPSULE | Freq: Every day | ORAL | Status: DC

## 2015-01-26 NOTE — Telephone Encounter (Signed)
Patient letting dr Dennard Schaumann know that celebrex is covered by her insurance and would like it sent to [facility] in [place] if possible  He said dr pickard told him to call and let him know [phone]

## 2015-01-26 NOTE — Progress Notes (Signed)
Subjective:    Patient ID: Jacob Yoder, male    DOB: 1956/05/04, 59 y.o.   MRN: [id number]  HPI Patient is here today for his complete physical exam. He reports tightness and discomfort in his chest. Been there for approximately 2 weeks. Patient denies any shortness of breath. He denies any exacerbation with activity. He denies any angina-like symptoms. He denies any tenderness to palpation. He denies any specific injury. The pain is constant. There are no exacerbating or alleviating factors. It is possible that he may have strained the muscles in his chest 2 weeks ago when he was moving some boxes at his home. He is concerned because his father and his paternal grandfather both had heart attacks at an early age. He also has several risk factors including age, smoking, hyperlipidemia. He is requesting a stress test. EKG today in the office shows normal sinus rhythm with no ischemia or infarction. Most recent lab work as listed below: Lab on 01/19/2015  Component Date Value Ref Range Status  . Sodium 01/19/2015 139  135 - 145 mEq/L Final  . Potassium 01/19/2015 5.2  3.5 - 5.3 mEq/L Final  . Chloride 01/19/2015 104  96 - 112 mEq/L Final  . CO2 01/19/2015 26  19 - 32 mEq/L Final  . Glucose, Bld 01/19/2015 97  70 - 99 mg/dL Final  . BUN 74/06/1447 25* 6 - 23 mg/dL Final  . Creat 18/56/3149 0.75  0.50 - 1.35 mg/dL Final  . Total Bilirubin 01/19/2015 0.6  0.2 - 1.2 mg/dL Final  . Alkaline Phosphatase 01/19/2015 64  39 - 117 U/L Final  . AST 01/19/2015 20  0 - 37 U/L Final  . ALT 01/19/2015 22  0 - 53 U/L Final  . Total Protein 01/19/2015 7.1  6.0 - 8.3 g/dL Final  . Albumin 70/26/3785 4.4  3.5 - 5.2 g/dL Final  . Calcium 88/50/2774 9.6  8.4 - 10.5 mg/dL Final  . GFR, Est African American 01/19/2015 >89   Final  . GFR, Est Non African American 01/19/2015 >89   Final   Comment:   The estimated GFR is a calculation valid for adults (>=44 years old) that uses the CKD-EPI algorithm to adjust for  age and sex. It is   not to be used for children, pregnant women, hospitalized patients,    patients on dialysis, or with rapidly changing kidney function. According to the NKDEP, eGFR >89 is normal, 60-89 shows mild impairment, 30-59 shows moderate impairment, 15-29 shows severe impairment and <15 is ESRD.     . WBC 01/19/2015 5.9  4.0 - 10.5 K/uL Final  . RBC 01/19/2015 4.57  4.22 - 5.81 MIL/uL Final  . Hemoglobin 01/19/2015 15.6  13.0 - 17.0 g/dL Final  . HCT 12/87/8676 45.8  39.0 - 52.0 % Final  . MCV 01/19/2015 100.2* 78.0 - 100.0 fL Final  . MCH 01/19/2015 34.1* 26.0 - 34.0 pg Final  . MCHC 01/19/2015 34.1  30.0 - 36.0 g/dL Final  . RDW 72/07/4708 13.2  11.5 - 15.5 % Final  . Platelets 01/19/2015 258  150 - 400 K/uL Final  . MPV 01/19/2015 9.8  8.6 - 12.4 fL Final  . Neutrophils Relative % 01/19/2015 50  43 - 77 % Final  . Neutro Abs 01/19/2015 3.0  1.7 - 7.7 K/uL Final  . Lymphocytes Relative 01/19/2015 38  12 - 46 % Final  . Lymphs Abs 01/19/2015 2.2  0.7 - 4.0 K/uL Final  . Monocytes Relative 01/19/2015  7  3 - 12 % Final  . Monocytes Absolute 01/19/2015 0.4  0.1 - 1.0 K/uL Final  . Eosinophils Relative 01/19/2015 4  0 - 5 % Final  . Eosinophils Absolute 01/19/2015 0.2  0.0 - 0.7 K/uL Final  . Basophils Relative 01/19/2015 1  0 - 1 % Final  . Basophils Absolute 01/19/2015 0.1  0.0 - 0.1 K/uL Final  . Smear Review 01/19/2015 Criteria for review not met   Final  . Cholesterol 01/19/2015 205* 0 - 200 mg/dL Final   Comment: ATP III Classification:       < 200        mg/dL        Desirable      200 - 239     mg/dL        Borderline High      >= 240        mg/dL        High     . Triglycerides 01/19/2015 86  <150 mg/dL Final  . HDL 01/19/2015 62  >=40 mg/dL Final   ** Please note change in reference range(s). **  . Total CHOL/HDL Ratio 01/19/2015 3.3   Final  . VLDL 01/19/2015 17  0 - 40 mg/dL Final  . LDL Cholesterol 01/19/2015 126* 0 - 99 mg/dL Final   Comment:   Total  Cholesterol/HDL Ratio:CHD Risk                        Coronary Heart Disease Risk Table                                        Men       Women          1/2 Average Risk              3.4        3.3              Average Risk              5.0        4.4           2X Average Risk              9.6        7.1           3X Average Risk             23.4       11.0 Use the calculated Patient Ratio above and the CHD Risk table  to determine the patient's CHD Risk. ATP III Classification (LDL):       < 100        mg/dL         Optimal      100 - 129     mg/dL         Near or Above Optimal      130 - 159     mg/dL         Borderline High      160 - 189     mg/dL         High       > 190        mg/dL         Very High     .  PSA 01/19/2015 0.97  <=4.00 ng/mL Final   Comment: Test Methodology: ECLIA PSA (Electrochemiluminescence Immunoassay)   For PSA values from 2.5-4.0, particularly in younger men <85 years old, the AUA and NCCN suggest testing for % Free PSA (3515) and evaluation of the rate of increase in PSA (PSA velocity).    Past Medical History  Diagnosis Date  . Hyperlipidemia   . DDD (degenerative disc disease), lumbar   . Smoker    No past surgical history on file. Current Outpatient Prescriptions on File Prior to Visit  Medication Sig Dispense Refill  . aspirin 81 MG tablet Take 81 mg by mouth daily.    . meloxicam (MOBIC) 15 MG tablet Take 1 tablet (15 mg total) by mouth daily. 360 tablet 0  . Multiple Vitamin (MULTIVITAMIN) tablet Take 1 tablet by mouth daily.    . simvastatin (ZOCOR) 40 MG tablet Take 1 tablet (40 mg total) by mouth at bedtime. 360 tablet 0   No current facility-administered medications on file prior to visit.   No Known Allergies History   Social History  . Marital Status: Divorced    Spouse Name: N/A  . Number of Children: N/A  . Years of Education: N/A   Occupational History  . Not on file.   Social History Main Topics  . Smoking status:  Current Every Day Smoker -- 1.00 packs/day    Types: Cigarettes  . Smokeless tobacco: Not on file  . Alcohol Use: 1.0 oz/week    2 drink(s) per week  . Drug Use: No  . Sexual Activity: Not on file   Other Topics Concern  . Not on file   Social History Narrative   Family History  Problem Relation Age of Onset  . Heart disease Father   . Heart disease Paternal Grandfather       Review of Systems  All other systems reviewed and are negative.      Objective:   Physical Exam  Constitutional: He is oriented to person, place, and time. He appears well-developed and well-nourished. No distress.  HENT:  Head: Normocephalic and atraumatic.  Right Ear: External ear normal.  Left Ear: External ear normal.  Nose: Nose normal.  Mouth/Throat: Oropharynx is clear and moist. No oropharyngeal exudate.  Eyes: Conjunctivae and EOM are normal. Pupils are equal, round, and reactive to light. Right eye exhibits no discharge. Left eye exhibits no discharge. No scleral icterus.  Neck: Normal range of motion. Neck supple. No JVD present. No tracheal deviation present. No thyromegaly present.  Cardiovascular: Normal rate, regular rhythm, normal heart sounds and intact distal pulses.  Exam reveals no gallop and no friction rub.   No murmur heard. Pulmonary/Chest: Effort normal and breath sounds normal. No stridor. No respiratory distress. He has no wheezes. He has no rales. He exhibits no tenderness.  Abdominal: Soft. Bowel sounds are normal. He exhibits no distension and no mass. There is no tenderness. There is no rebound and no guarding.  Genitourinary: Rectum normal, prostate normal and penis normal.  Musculoskeletal: Normal range of motion. He exhibits no edema or tenderness.  Lymphadenopathy:    He has no cervical adenopathy.  Neurological: He is alert and oriented to person, place, and time. He has normal reflexes. He displays normal reflexes. No cranial nerve deficit. He exhibits normal  muscle tone. Coordination normal.  Skin: Skin is warm. No rash noted. He is not diaphoretic. No erythema. No pallor.  Psychiatric: He has a normal mood and affect. His behavior is  normal. Judgment and thought content normal.  Vitals reviewed.         Assessment & Plan:  Routine general medical examination at a health care facility  Chest pain, unspecified chest pain type - Plan: EKG 12-Lead, DG Chest 2 View, Ambulatory referral to Cardiology  Patient's physical exam is normal. Patient's cholesterol is much better. His prostate exam is normal. His labs are excellent. I did recommend a colonoscopy but the patient would like to defer that at the present time. He will call me later this year when he wants me to schedule it. I believe his chest pain is chest wall pain and likely a strained muscle. However the patient is very concerned given his family history. He would like to see a cardiologist for a stress test for second opinion. I'll gladly schedule the stress test however I believe his pain is most likely musculoskeletal. Given his smoking status and also obtain a chest x-ray. His blood pressure today is borderline. I've recommended the patient check his blood pressure several times over the next 1-2 weeks and then provide the values for me to review. If his blood pressures consistently 140/90 or greater I would add blood pressure medication

## 2015-01-26 NOTE — Telephone Encounter (Signed)
Done

## 2015-02-02 MED ORDER — CELECOXIB 200 MG PO CAPS: 200.0000 mg | ORAL_CAPSULE | Freq: Every day | ORAL | Status: DC

## 2015-02-02 NOTE — Telephone Encounter (Signed)
PT has called back and left a message about the celebrex being expensive [phone]

## 2015-02-02 NOTE — Telephone Encounter (Signed)
Per pt his mail order will cover the generic Celebrex and would like it to go to [facility]. Med sent and pt aware

## 2015-03-02 ENCOUNTER — Encounter: Payer: Self-pay | Admitting: Internal Medicine

## 2015-03-02 ENCOUNTER — Ambulatory Visit (INDEPENDENT_AMBULATORY_CARE_PROVIDER_SITE_OTHER): Payer: 59 | Admitting: Internal Medicine

## 2015-03-02 VITALS — BP 136/82 | HR 70 | Ht 68.0 in | Wt 161.4 lb

## 2015-03-02 DIAGNOSIS — R079 Chest pain, unspecified: Secondary | ICD-10-CM

## 2015-03-02 DIAGNOSIS — R0789 Other chest pain: Secondary | ICD-10-CM | POA: Diagnosis not present

## 2015-03-02 DIAGNOSIS — F17200 Nicotine dependence, unspecified, uncomplicated: Secondary | ICD-10-CM

## 2015-03-02 DIAGNOSIS — E785 Hyperlipidemia, unspecified: Secondary | ICD-10-CM

## 2015-03-02 DIAGNOSIS — Z720 Tobacco use: Secondary | ICD-10-CM | POA: Diagnosis not present

## 2015-03-02 DIAGNOSIS — R0609 Other forms of dyspnea: Secondary | ICD-10-CM | POA: Diagnosis not present

## 2015-03-02 NOTE — Progress Notes (Signed)
OFFICE NOTE  Chief Complaint:  DOE, Atypical chest pain  Primary Care Physician: Odette Fraction, MD  HPI:  Jacob Yoder is a pleasant 59 year old male who is coming referred to me for evaluation of shortness of breath and chest discomfort. He tells me that about 1-1/2 months ago he was helping to move his fiance's belongings from her apartment to his. He was caring several heavy items up 3 flights of stairs and felt short of breath doing that activity. He did not have any chest pain during that activity, however the next day he had soreness in his chest which persisted for several weeks. There was no worsening of symptoms with exertion. No radiation to his jaw or arm. He does report getting short of breath with activity but generally improves with rest. He says that recently its worst and it has been in the past. He is a smoker and has a long-standing smoking history. He also has dyslipidemia and a family history of coronary disease particularly in his father who had heart disease but ultimately died of a bone cancer. He also has a sister with hypertension and dyslipidemia.  PMHx:  Past Medical History  Diagnosis Date  . Hyperlipidemia   . DDD (degenerative disc disease), lumbar   . Smoker     No past surgical history on file.  FAMHx:  Family History  Problem Relation Age of Onset  . Heart disease Paternal Grandfather   . Hyperlipidemia Mother   . Heart disease Father   . Cancer Father   . Hypertension Sister   . Hyperlipidemia Sister     SOCHx:   reports that he has been smoking Cigarettes.  He has a 17.5 pack-year smoking history. He has never used smokeless tobacco. He reports that he drinks about 4.2 oz of alcohol per week. He reports that he does not use illicit drugs.  ALLERGIES:  No Known Allergies  ROS: A comprehensive review of systems was negative except for: Respiratory: positive for dyspnea on exertion Cardiovascular: positive for chest pain  HOME  MEDS: Current Outpatient Prescriptions  Medication Sig Dispense Refill  . aspirin 81 MG tablet Take 81 mg by mouth daily.    . celecoxib (CELEBREX) 200 MG capsule Take 1 capsule (200 mg total) by mouth daily. 90 capsule 3  . Multiple Vitamin (MULTIVITAMIN) tablet Take 1 tablet by mouth daily.    . simvastatin (ZOCOR) 40 MG tablet Take 1 tablet (40 mg total) by mouth at bedtime. 360 tablet 0   No current facility-administered medications for this visit.    LABS/IMAGING: No results found for this or any previous visit (from the past 48 hour(s)). No results found.  WEIGHTS: Wt Readings from Last 3 Encounters:  03/02/15 161 lb 6.4 oz (73.211 kg)  01/26/15 164 lb (74.39 kg)  10/06/14 165 lb (74.844 kg)    VITALS: BP 136/82 mmHg  Pulse 70  Ht 5\' 8"  (1.727 m)  Wt 161 lb 6.4 oz (73.211 kg)  BMI 24.55 kg/m2  EXAM: General appearance: alert and no distress Neck: no carotid bruit, no JVD and thyroid not enlarged, symmetric, no tenderness/mass/nodules Lungs: clear to auscultation bilaterally Heart: regular rate and rhythm, S1, S2 normal, no murmur, click, rub or gallop Abdomen: soft, non-tender; bowel sounds normal; no masses,  no organomegaly Extremities: extremities normal, atraumatic, no cyanosis or edema Pulses: 2+ and symmetric Skin: Skin color, texture, turgor normal. No rashes or lesions Neurologic: Grossly normal Psych: Pleasant  EKG: Normal sinus rhythm at  70  ASSESSMENT: 1. Atypical chest pain 2. Dyspnea on exertion 3. Smoker 4. Dyslipidemia 5. Family history of coronary artery disease  PLAN: 1.   Mr. Aspinall has several cardiac risk factors however his symptoms sound atypical. He developed pain and discomfort in the chest which was constant and started after exertion. He has reported some increase in dyspnea which could be an anginal equivalent. He also is a smoker and may have some early COPD. I recommend a treadmill exercise stress test for risk stratification. We  also discussed smoking cessation and he says he is currently working on that including using electronic cigarette. He remains on Zocor 40 mg daily which is helped control his cholesterol. He also takes Celebrex daily, which could slightly increase his risk of coronary disease. This has to be balanced with his overall pain needs regarding osteoarthritis.  Thanks for the kind referral. I'll plan to see him back to discuss results of the stress test in a few weeks.  Pixie Casino, MD, [facility] Attending Cardiologist [facility]  Zeph Riebel C 03/02/2015, 10:16 AM

## 2015-03-02 NOTE — Patient Instructions (Signed)
Dr. Debara Pickett has ordered an exercise tolerance test.  Dr. Debara Pickett would like you to follow up after your stress test.    Exercise Stress Electrocardiogram An exercise stress electrocardiogram is a test that is done to evaluate the blood supply to your heart. This test may also be called exercise stress electrocardiography. The test is done while you are walking on a treadmill. The goal of this test is to raise your heart rate. This test is done to find areas of poor blood flow to the heart by determining the extent of coronary artery disease (CAD).   CAD is defined as narrowing in one or more heart (coronary) arteries of more than 70%. If you have an abnormal test result, this may mean that you are not getting adequate blood flow to your heart during exercise. Additional testing may be needed to understand why your test was abnormal. LET [facility] CARE PROVIDER KNOW ABOUT:   Any allergies you have.  All medicines you are taking, including vitamins, herbs, eye drops, creams, and over-the-counter medicines.  Previous problems you or members of your family have had with the use of anesthetics.  Any blood disorders you have.  Previous surgeries you have had.  Medical conditions you have.  Possibility of pregnancy, if this applies. RISKS AND COMPLICATIONS Generally, this is a safe procedure. However, as with any procedure, complications can occur. Possible complications can include:  Pain or pressure in the following areas:  Chest.  Jaw or neck.  Between your shoulder blades.  Radiating down your left arm.  Dizziness or light-headedness.  Shortness of breath.  Increased or irregular heartbeats.  Nausea or vomiting.  Heart attack (rare). BEFORE THE PROCEDURE  Avoid all forms of caffeine 24 hours before your test or as directed by your health care provider. This includes coffee, tea (even decaffeinated tea), caffeinated sodas, chocolate, cocoa, and certain pain  medicines.  Follow your health care provider's instructions regarding eating and drinking before the test.  Take your medicines as directed at regular times with water unless instructed otherwise. Exceptions may include:  If you have diabetes, ask how you are to take your insulin or pills. It is common to adjust insulin dosing the morning of the test.  If you are taking beta-blocker medicines, it is important to talk to your health care provider about these medicines well before the date of your test. Taking beta-blocker medicines may interfere with the test. In some cases, these medicines need to be changed or stopped 24 hours or more before the test.  If you wear a nitroglycerin patch, it may need to be removed prior to the test. Ask your health care provider if the patch should be removed before the test.  If you use an inhaler for any breathing condition, bring it with you to the test.  If you are an outpatient, bring a snack so you can eat right after the stress phase of the test.  Do not smoke for 4 hours prior to the test or as directed by your health care provider.  Do not apply lotions, powders, creams, or oils on your chest prior to the test.  Wear loose-fitting clothes and comfortable shoes for the test. This test involves walking on a treadmill. PROCEDURE  Multiple patches (electrodes) will be put on your chest. If needed, small areas of your chest may have to be shaved to get better contact with the electrodes. Once the electrodes are attached to your body, multiple wires will be attached  to the electrodes and your heart rate will be monitored.  Your heart will be monitored both at rest and while exercising.  You will walk on a treadmill. The treadmill will be started at a slow pace. The treadmill speed and incline will gradually be increased to raise your heart rate. AFTER THE PROCEDURE  Your heart rate and blood pressure will be monitored after the test.  You may return  to your normal schedule including diet, activities, and medicines, unless your health care provider tells you otherwise. Document Released: 10/17/2000 Document Revised: 10/25/2013 Document Reviewed: 06/27/2013 [facility] Patient Information 2015 [place], [place]. This information is not intended to replace advice given to you by your health care provider. Make sure you discuss any questions you have with your health care provider.

## 2015-03-15 ENCOUNTER — Ambulatory Visit (INDEPENDENT_AMBULATORY_CARE_PROVIDER_SITE_OTHER): Payer: 59 | Admitting: Physician Assistant

## 2015-03-15 ENCOUNTER — Encounter: Payer: Self-pay | Admitting: Physician Assistant

## 2015-03-15 ENCOUNTER — Encounter: Payer: Self-pay | Admitting: Family Medicine

## 2015-03-15 VITALS — BP 140/60 | HR 52 | Temp 99.8°F | Resp 18 | Wt 158.0 lb

## 2015-03-15 DIAGNOSIS — J988 Other specified respiratory disorders: Secondary | ICD-10-CM

## 2015-03-15 DIAGNOSIS — B9689 Other specified bacterial agents as the cause of diseases classified elsewhere: Principal | ICD-10-CM

## 2015-03-15 MED ORDER — AZITHROMYCIN 250 MG PO TABS: ORAL_TABLET | ORAL | Status: DC

## 2015-03-15 NOTE — Progress Notes (Signed)
Patient ID: Jacob Yoder MRN: [id number], DOB: 29-Sep-1956, 59 y.o. Date of Encounter: 03/15/2015, 12:10 PM    Chief Complaint:  Chief Complaint  Patient presents with  . Headache    start tuesday with all the issues.  . Chest Pain  . Cough     HPI: 59 y.o. year old white male since with above symptoms. States that his fiance is ill and he had to take her to the emergency room Saturday night and she was diagnosed with bronchitis. Is that he thinks he has gotten this from her as he is having similar symptoms as she has had. He states that he felt fine on Monday 03/12/15. Visit to use day he developed runny nose and he felt weak and had a slight headache. States that Wednesday which was yesterday he went to work but then after just a few hours had to leave work because he felt so bad. Had slight headache but started feeling a lot of congestion in his chest. Runny nose was not as bad that day as it had been the prior day. To be moving down into his chest and getting that area worse. Day he states that now the nose is continues to be better but now the chest is worse. Says that he is coughing up a lot of very thick dark phlegm. Is that he can feel it in his chest when he coughs. He does smoke. Has not had any known fevers or chills. No significant sore throat or and no ear ache.     Home Meds:   Outpatient Prescriptions Prior to Visit  Medication Sig Dispense Refill  . aspirin 81 MG tablet Take 81 mg by mouth daily.    . celecoxib (CELEBREX) 200 MG capsule Take 1 capsule (200 mg total) by mouth daily. 90 capsule 3  . Multiple Vitamin (MULTIVITAMIN) tablet Take 1 tablet by mouth daily.    . simvastatin (ZOCOR) 40 MG tablet Take 1 tablet (40 mg total) by mouth at bedtime. 360 tablet 0   No facility-administered medications prior to visit.    Allergies: No Known Allergies    Review of Systems: See HPI for pertinent ROS. All other ROS negative.    Physical Exam: Blood pressure  140/60, pulse 52, temperature 99.8 F (37.7 C), temperature source Oral, resp. rate 18, weight 158 lb (71.668 kg)., Body mass index is 24.03 kg/(m^2). General:  WNWD WM. Appears in no acute distress. HEENT: Normocephalic, atraumatic, eyes without discharge, sclera non-icteric, nares are without discharge. Bilateral auditory canals clear, TM's are without perforation, pearly grey and translucent with reflective cone of light bilaterally. Oral cavity moist, posterior pharynx without exudate, erythema, peritonsillar abscess. No tenderness with percussion of frontal and maxillary sinuses bilaterally.  Neck: Supple. No thyromegaly. No lymphadenopathy. Lungs: Clear bilaterally to auscultation without wheezes, rales, or rhonchi. Breathing is unlabored. Heart: Regular rhythm. No murmurs, rubs, or gallops. Msk:  Strength and tone normal for age. Extremities/Skin: Warm and dry. Neuro: Alert and oriented X 3. Moves all extremities spontaneously. Gait is normal. CNII-XII grossly in tact. Psych:  Responds to questions appropriately with a normal affect.     ASSESSMENT AND PLAN:  59 y.o. year old male with  1. Bacterial respiratory infection Take antibiotic as directed and complete all of it. Also use Mucinex DM as expectorant. Drink lots of fluids. Follow-up if symptoms do not resolve within 1 week after completion of antibiotic. Note given for out of work yesterday and today. States that  he is already off  Work tomorrow.  - azithromycin (ZITHROMAX) 250 MG tablet; Day 1: Take 2 daily. Days 2-5: Take 1 daily.  Dispense: 6 tablet; Refill: 0   Signed, [address] [place], [place], [facility] 03/15/2015 12:10 PM

## 2015-04-04 ENCOUNTER — Telehealth (HOSPITAL_COMMUNITY): Payer: Self-pay

## 2015-04-04 NOTE — Telephone Encounter (Signed)
Encounter complete. 

## 2015-04-06 ENCOUNTER — Ambulatory Visit (HOSPITAL_COMMUNITY)
Admission: RE | Admit: 2015-04-06 | Discharge: 2015-04-06 | Disposition: A | Discharge status: Home / Self Care | Payer: 59 | Source: Ambulatory Visit | Attending: Cardiovascular Disease | Admitting: Cardiovascular Disease

## 2015-04-06 DIAGNOSIS — R079 Chest pain, unspecified: Secondary | ICD-10-CM | POA: Diagnosis present

## 2015-04-06 LAB — EXERCISE TOLERANCE TEST
CHL CUP RESTING HR STRESS: 84 {beats}/min
CHL CUP STRESS STAGE 1 DBP: 85 mmHg
CHL CUP STRESS STAGE 1 HR: 85 {beats}/min
CHL CUP STRESS STAGE 1 SBP: 131 mmHg
CHL CUP STRESS STAGE 3 GRADE: 0.1 %
CHL CUP STRESS STAGE 3 HR: 83 {beats}/min
CHL CUP STRESS STAGE 3 SPEED: 1 mph
CHL CUP STRESS STAGE 4 GRADE: 10 %
CHL CUP STRESS STAGE 4 HR: 111 {beats}/min
CHL CUP STRESS STAGE 4 SPEED: 1.7 mph
CHL CUP STRESS STAGE 5 GRADE: 12 %
CHL CUP STRESS STAGE 5 HR: 127 {beats}/min
CHL CUP STRESS STAGE 6 SPEED: 3.4 mph
CHL CUP STRESS STAGE 7 SPEED: 3.4 mph
CHL CUP STRESS STAGE 8 GRADE: 0 %
CHL CUP STRESS STAGE 8 HR: 137 {beats}/min
CHL CUP STRESS STAGE 9 DBP: 73 mmHg
CHL CUP STRESS STAGE 9 SBP: 112 mmHg
CSEPEW: 10.1 METS
CSEPPMHR: 94 %
Exercise duration (min): 9 min
MPHR: 160 {beats}/min
Peak HR: 151 {beats}/min
Percent HR: 94 %
RPE: 23858
Stage 1 Grade: 0 %
Stage 1 Speed: 0 mph
Stage 2 Grade: 0 %
Stage 2 HR: 84 {beats}/min
Stage 2 Speed: 1 mph
Stage 4 DBP: 82 mmHg
Stage 4 SBP: 137 mmHg
Stage 5 DBP: 77 mmHg
Stage 5 SBP: 151 mmHg
Stage 5 Speed: 2.5 mph
Stage 6 DBP: 78 mmHg
Stage 6 Grade: 14 %
Stage 6 HR: 151 {beats}/min
Stage 6 SBP: 158 mmHg
Stage 7 Grade: 14.1 %
Stage 7 HR: 151 {beats}/min
Stage 8 DBP: 69 mmHg
Stage 8 SBP: 160 mmHg
Stage 8 Speed: 0 mph
Stage 9 Grade: 0 %
Stage 9 HR: 98 {beats}/min
Stage 9 Speed: 0 mph

## 2015-04-09 ENCOUNTER — Telehealth: Payer: Self-pay | Admitting: Internal Medicine

## 2015-04-09 NOTE — Telephone Encounter (Signed)
Returning your call,if not there please leave results.

## 2015-04-09 NOTE — Telephone Encounter (Signed)
LM on name-verified VM with stress test results

## 2015-05-25 ENCOUNTER — Ambulatory Visit: Payer: 59 | Admitting: Internal Medicine

## 2015-09-13 ENCOUNTER — Encounter: Payer: Self-pay | Admitting: Physician Assistant

## 2015-09-13 ENCOUNTER — Ambulatory Visit (INDEPENDENT_AMBULATORY_CARE_PROVIDER_SITE_OTHER): Payer: 59 | Admitting: Physician Assistant

## 2015-09-13 ENCOUNTER — Encounter: Payer: Self-pay | Admitting: Family Medicine

## 2015-09-13 VITALS — BP 122/78 | HR 80 | Temp 98.3°F | Resp 18 | Wt 162.0 lb

## 2015-09-13 DIAGNOSIS — H109 Unspecified conjunctivitis: Secondary | ICD-10-CM | POA: Diagnosis not present

## 2015-09-13 DIAGNOSIS — J029 Acute pharyngitis, unspecified: Secondary | ICD-10-CM | POA: Diagnosis not present

## 2015-09-13 DIAGNOSIS — J988 Other specified respiratory disorders: Secondary | ICD-10-CM

## 2015-09-13 DIAGNOSIS — B9689 Other specified bacterial agents as the cause of diseases classified elsewhere: Secondary | ICD-10-CM

## 2015-09-13 LAB — RAPID STREP SCREEN (MED CTR MEBANE ONLY): Streptococcus, Group A Screen (Direct): NEGATIVE

## 2015-09-13 MED ORDER — AZITHROMYCIN 250 MG PO TABS: ORAL_TABLET | ORAL | Status: DC

## 2015-09-13 MED ORDER — SULFACETAMIDE SODIUM 10 % OP SOLN: 1.0000 [drp] | OPHTHALMIC | Status: DC

## 2015-09-13 NOTE — Progress Notes (Signed)
Patient ID: Jacob Yoder MRN: [id number], DOB: [birth year], 59 y.o. Date of Encounter: 09/13/2015, 4:43 PM    Chief Complaint:  Chief Complaint  Patient presents with  . sick x 4 days    sore throat, congestion, rt eye matted in AM     HPI: 59 y.o. year old male states that he has been sick for 4-5 days. His right eye has been matted in the mornings and seeing crust on the lashes. Says his throat is a little sore and also drainage in the throat. Says he has cough that seems to mostly be secondary to drainage in the throat not really much deep chest congestion. Says he's had some runny nose yesterday and today. Says that he definitely is not getting any better. Also is concerned because in one week he is supposed to be going to [place] to see his 60 year old mother.     Home Meds:   Outpatient Prescriptions Prior to Visit  Medication Sig Dispense Refill  . aspirin 81 MG tablet Take 81 mg by mouth daily.    . celecoxib (CELEBREX) 200 MG capsule Take 1 capsule (200 mg total) by mouth daily. 90 capsule 3  . Multiple Vitamin (MULTIVITAMIN) tablet Take 1 tablet by mouth daily.    . simvastatin (ZOCOR) 40 MG tablet Take 1 tablet (40 mg total) by mouth at bedtime. 360 tablet 0  . azithromycin (ZITHROMAX) 250 MG tablet Day 1: Take 2 daily. Days 2-5: Take 1 daily. 6 tablet 0   No facility-administered medications prior to visit.    Allergies: No Known Allergies    Review of Systems: See HPI for pertinent ROS. All other ROS negative.    Physical Exam: Blood pressure 122/78, pulse 80, temperature 98.3 F (36.8 C), temperature source Oral, resp. rate 18, weight 162 lb (73.483 kg)., Body mass index is 24.64 kg/(m^2). General:  WNWD WM. Appears in no acute distress. HEENT: Normocephalic, atraumatic, eyes without discharge, sclera non-icteric, nares are without discharge. Bilateral auditory canals clear, TM's are without perforation, pearly grey and translucent with reflective cone of  light bilaterally. Oral cavity moist, posterior pharynx without exudate, erythema, peritonsillar abscess.  Neck: Supple. No thyromegaly. No lymphadenopathy. Lungs: Clear bilaterally to auscultation without wheezes, rales, or rhonchi. Breathing is unlabored. Heart: Regular rhythm. No murmurs, rubs, or gallops. Msk:  Strength and tone normal for age. Extremities/Skin: Warm and dry.  Neuro: Alert and oriented X 3. Moves all extremities spontaneously. Gait is normal. CNII-XII grossly in tact. Psych:  Responds to questions appropriately with a normal affect.   Results for orders placed or performed in visit on 09/13/15  Rapid strep screen (not at [facility])  Result Value Ref Range   Source THROAT    Streptococcus, Group A Screen (Direct) NEG NEGATIVE     ASSESSMENT AND PLAN:  59 y.o. year old male with  1. Bacterial respiratory infection Take antibiotic as directed. Follow-up if symptoms do not resolve after completion of this. - azithromycin (ZITHROMAX) 250 MG tablet; Day 1: Take 2 daily. Days 2-5: Take 1 daily.  Dispense: 6 tablet; Refill: 0  2. Conjunctivitis of right eye Apply drops as directed. Discussed that this is contagious and to keep fingers out of the eyes and if does have to clean the eyes then wash with soap. - sulfacetamide (BLEPH-10) 10 % ophthalmic solution; Place 1 drop into the right eye every 3 (three) hours.  Dispense: 10 mL; Refill: 0  3. Sorethroat - Rapid strep screen (not at  [facility])   Signed, [address] [place], [place], [facility] 09/13/2015 4:43 PM

## 2015-12-17 ENCOUNTER — Ambulatory Visit (INDEPENDENT_AMBULATORY_CARE_PROVIDER_SITE_OTHER): Payer: 59 | Admitting: Family Medicine

## 2015-12-17 DIAGNOSIS — Z23 Encounter for immunization: Secondary | ICD-10-CM

## 2015-12-17 NOTE — Progress Notes (Signed)
Pt sustained small puncture wound to left little finger several days ago.  Thought he needed to get a Tetanus booster.  No tetanus on file.  TDAP given

## 2016-01-01 ENCOUNTER — Encounter: Payer: Self-pay | Admitting: *Deleted

## 2016-04-08 ENCOUNTER — Other Ambulatory Visit: Payer: Self-pay | Admitting: Family Medicine

## 2016-09-15 ENCOUNTER — Other Ambulatory Visit: Payer: Self-pay | Admitting: Family Medicine

## 2016-09-15 MED ORDER — CELECOXIB 200 MG PO CAPS: 200.0000 mg | ORAL_CAPSULE | Freq: Every day | ORAL | 0 refills | Status: DC

## 2016-12-08 ENCOUNTER — Encounter: Payer: Self-pay | Admitting: Family Medicine

## 2016-12-08 ENCOUNTER — Ambulatory Visit (INDEPENDENT_AMBULATORY_CARE_PROVIDER_SITE_OTHER): Payer: 59 | Admitting: Family Medicine

## 2016-12-08 VITALS — BP 144/88 | HR 100 | Temp 98.0°F | Resp 20 | Ht 68.0 in | Wt 162.0 lb

## 2016-12-08 DIAGNOSIS — J069 Acute upper respiratory infection, unspecified: Secondary | ICD-10-CM | POA: Diagnosis not present

## 2016-12-08 DIAGNOSIS — E7800 Pure hypercholesterolemia, unspecified: Secondary | ICD-10-CM | POA: Diagnosis not present

## 2016-12-08 DIAGNOSIS — Z1211 Encounter for screening for malignant neoplasm of colon: Secondary | ICD-10-CM | POA: Diagnosis not present

## 2016-12-08 MED ORDER — CELECOXIB 200 MG PO CAPS: 200.0000 mg | ORAL_CAPSULE | Freq: Every day | ORAL | 3 refills | Status: DC

## 2016-12-08 MED ORDER — AZITHROMYCIN 250 MG PO TABS: ORAL_TABLET | ORAL | 0 refills | Status: DC

## 2016-12-08 NOTE — Progress Notes (Signed)
   Subjective:    Patient ID: Jacob Yoder, male    DOB: 01-24-1956, 61 y.o.   MRN: [id number]  HPI  Patient has not been seen since March 2016. At that time he was started on simvastatin 20 mg a day for hyperlipidemia. He is long overdue to recheck his fasting lipid panel. He denies any side effects from simvastatin. He is overdue for colonoscopy. His main concern today though is his illness. He states that I need a Z-Pak. Symptoms began 4 days ago. He denies any fever. He denies any body aches. He reports head congestion, rhinorrhea, postnasal drip, sore scratchy throat, and cough productive of green and sometimes clear sputum. He denies any chest pain shortness of breath or dyspnea on exertion Past Medical History:  Diagnosis Date  . DDD (degenerative disc disease), lumbar   . Hyperlipidemia   . Smoker    No past surgical history on file. Current Outpatient Prescriptions on File Prior to Visit  Medication Sig Dispense Refill  . aspirin 81 MG tablet Take 81 mg by mouth daily.    . Multiple Vitamin (MULTIVITAMIN) tablet Take 1 tablet by mouth daily.    . simvastatin (ZOCOR) 40 MG tablet Take 1 tablet (40 mg total) by mouth at bedtime. 360 tablet 0   No current facility-administered medications on file prior to visit.    No Known Allergies Social History   Social History  . Marital status: Divorced    Spouse name: N/A  . Number of children: N/A  . Years of education: N/A   Occupational History  . Not on file.   Social History Main Topics  . Smoking status: Current Every Day Smoker    Packs/day: 0.50    Years: 35.00    Types: Cigarettes  . Smokeless tobacco: Never Used  . Alcohol use 4.2 oz/week    7 Standard drinks or equivalent per week  . Drug use: No  . Sexual activity: Not on file   Other Topics Concern  . Not on file   Social History Narrative  . No narrative on file     Review of Systems  All other systems reviewed and are negative.      Objective:   Physical Exam  HENT:  Right Ear: External ear normal.  Left Ear: External ear normal.  Nose: Mucosal edema and rhinorrhea present.  Mouth/Throat: Oropharynx is clear and moist. No oropharyngeal exudate.  Eyes: Conjunctivae are normal.  Neck: Neck supple.  Cardiovascular: Normal rate, regular rhythm and normal heart sounds.   Pulmonary/Chest: Effort normal and breath sounds normal. No respiratory distress. He has no wheezes. He has no rales.  Lymphadenopathy:    He has no cervical adenopathy.  Vitals reviewed.         Assessment & Plan:  URI, acute - Plan: azithromycin (ZITHROMAX) 250 MG tablet  Screen for colon cancer - Plan: Ambulatory referral to Gastroenterology  Pure hypercholesterolemia - Plan: COMPLETE METABOLIC PANEL WITH GFR, Lipid panel  I'll schedule the patient for colonoscopy. I like him to return fasting for fasting lipid panel. Goal LDL cholesterol is less than 130. I explained to the patient has a viral upper respiratory infection. He does not need a Z-Pak. He needs to allow time for this to improve. I did give the patient a prescription but I asked him not to fill it unless symptoms worsen dramatically, he develops a high fever, or he develops shortness of breath with purulent sputum

## 2016-12-13 ENCOUNTER — Other Ambulatory Visit: Payer: Self-pay | Admitting: Family Medicine

## 2016-12-26 ENCOUNTER — Telehealth: Payer: Self-pay | Admitting: Family Medicine

## 2016-12-26 ENCOUNTER — Encounter: Payer: Self-pay | Admitting: Gastroenterology

## 2016-12-26 MED ORDER — SIMVASTATIN 40 MG PO TABS: 40.0000 mg | ORAL_TABLET | Freq: Every day | ORAL | 3 refills | Status: DC

## 2016-12-26 MED ORDER — CELECOXIB 200 MG PO CAPS: 200.0000 mg | ORAL_CAPSULE | Freq: Every day | ORAL | 3 refills | Status: DC

## 2016-12-26 NOTE — Telephone Encounter (Signed)
Pt needs refill on zocor asap and would like a 90day supply, also wants to know if he can get celebrex in 90 day supply. [facility]

## 2016-12-26 NOTE — Telephone Encounter (Signed)
Medication called/sent to requested pharmacy  

## 2017-01-02 ENCOUNTER — Other Ambulatory Visit: Payer: 59

## 2017-01-02 LAB — COMPLETE METABOLIC PANEL WITH GFR
ALBUMIN: 4.2 g/dL (ref 3.6–5.1)
ALK PHOS: 64 U/L (ref 40–115)
ALT: 18 U/L (ref 9–46)
AST: 18 U/L (ref 10–35)
BUN: 23 mg/dL (ref 7–25)
CALCIUM: 9.2 mg/dL (ref 8.6–10.3)
CO2: 25 mmol/L (ref 20–31)
CREATININE: 0.8 mg/dL (ref 0.70–1.25)
Chloride: 110 mmol/L (ref 98–110)
GFR, Est African American: >89 mL/min (ref >=60)
GFR, Est Non African American: >89 mL/min (ref >=60)
GLUCOSE: 95 mg/dL (ref 70–99)
Potassium: 4.8 mmol/L (ref 3.5–5.3)
SODIUM: 143 mmol/L (ref 135–146)
TOTAL PROTEIN: 6.5 g/dL (ref 6.1–8.1)
Total Bilirubin: 0.5 mg/dL (ref 0.2–1.2)

## 2017-01-02 LAB — LIPID PANEL
Cholesterol: 181 mg/dL (ref <200)
HDL: 68 mg/dL (ref >40)
LDL Cholesterol: 102 mg/dL — ABNORMAL HIGH (ref <100)
Total CHOL/HDL Ratio: 2.7 Ratio (ref <5.0)
Triglycerides: 55 mg/dL (ref <150)
VLDL: 11 mg/dL (ref <30)

## 2017-01-07 ENCOUNTER — Encounter: Payer: Self-pay | Admitting: Family Medicine

## 2017-02-13 ENCOUNTER — Ambulatory Visit (AMBULATORY_SURGERY_CENTER): Payer: Self-pay

## 2017-02-13 VITALS — Ht 67.5 in | Wt 162.6 lb

## 2017-02-13 DIAGNOSIS — Z1211 Encounter for screening for malignant neoplasm of colon: Secondary | ICD-10-CM

## 2017-02-13 MED ORDER — SUPREP BOWEL PREP KIT 17.5-3.13-1.6 GM/177ML PO SOLN: 1.0000 | Freq: Once | ORAL | 0 refills | Status: AC

## 2017-02-13 NOTE — Progress Notes (Signed)
No diet meds No home oxygen No past problems with anesthesia No allergies to eggs or soy  Registered for emmi  Had bad experience with conscious sedation; wants to be sure he is asleep for the entire colonoscopy.

## 2017-02-16 ENCOUNTER — Encounter: Payer: Self-pay | Admitting: Gastroenterology

## 2017-02-27 ENCOUNTER — Ambulatory Visit (AMBULATORY_SURGERY_CENTER): Payer: 59 | Admitting: Gastroenterology

## 2017-02-27 ENCOUNTER — Encounter: Payer: Self-pay | Admitting: Gastroenterology

## 2017-02-27 VITALS — BP 94/70 | HR 75 | Temp 98.0°F | Resp 9 | Ht 67.5 in | Wt 162.0 lb

## 2017-02-27 DIAGNOSIS — D127 Benign neoplasm of rectosigmoid junction: Secondary | ICD-10-CM

## 2017-02-27 DIAGNOSIS — D128 Benign neoplasm of rectum: Secondary | ICD-10-CM

## 2017-02-27 DIAGNOSIS — D125 Benign neoplasm of sigmoid colon: Secondary | ICD-10-CM

## 2017-02-27 DIAGNOSIS — Z1211 Encounter for screening for malignant neoplasm of colon: Secondary | ICD-10-CM

## 2017-02-27 DIAGNOSIS — K621 Rectal polyp: Secondary | ICD-10-CM | POA: Diagnosis not present

## 2017-02-27 DIAGNOSIS — D123 Benign neoplasm of transverse colon: Secondary | ICD-10-CM

## 2017-02-27 DIAGNOSIS — Z1212 Encounter for screening for malignant neoplasm of rectum: Secondary | ICD-10-CM | POA: Diagnosis not present

## 2017-02-27 DIAGNOSIS — D129 Benign neoplasm of anus and anal canal: Secondary | ICD-10-CM

## 2017-02-27 DIAGNOSIS — D122 Benign neoplasm of ascending colon: Secondary | ICD-10-CM

## 2017-02-27 DIAGNOSIS — K6389 Other specified diseases of intestine: Secondary | ICD-10-CM | POA: Diagnosis not present

## 2017-02-27 DIAGNOSIS — K635 Polyp of colon: Secondary | ICD-10-CM

## 2017-02-27 MED ORDER — SODIUM CHLORIDE 0.9 % IV SOLN: 500.0000 mL | INTRAVENOUS | Status: DC

## 2017-02-27 NOTE — Op Note (Signed)
[facility] Patient Name: Jacob Yoder Procedure Date: 02/27/2017 8:52 AM MRN: [id number] Endoscopist: Remo Lipps P. Armbruster MD, MD Age: 61 Referring MD:  Date of Birth: 1956-04-14 Gender: Male Account #: [id number] Procedure:                Colonoscopy Indications:              Screening for colorectal malignant neoplasm Medicines:                Monitored Anesthesia Care Procedure:                Pre-Anesthesia Assessment:                           - Prior to the procedure, a History and Physical                            was performed, and patient medications and                            allergies were reviewed. The patient's tolerance of                            previous anesthesia was also reviewed. The risks                            and benefits of the procedure and the sedation                            options and risks were discussed with the patient.                            All questions were answered, and informed consent                            was obtained. Prior Anticoagulants: The patient has                            taken aspirin, last dose was 1 day prior to                            procedure. ASA Grade Assessment: II - A patient                            with mild systemic disease. After reviewing the                            risks and benefits, the patient was deemed in                            satisfactory condition to undergo the procedure.                           After obtaining informed consent, the colonoscope  was passed under direct vision. Throughout the                            procedure, the patient's blood pressure, pulse, and                            oxygen saturations were monitored continuously. The                            Colonoscope was introduced through the anus and                            advanced to the the cecum, identified by                            appendiceal orifice and  ileocecal valve. The                            colonoscopy was performed without difficulty. The                            patient tolerated the procedure well. The quality                            of the bowel preparation was good. The ileocecal                            valve, appendiceal orifice, and rectum were                            photographed. Scope In: 9:01:38 AM Scope Out: 9:25:19 AM Scope Withdrawal Time: 0 hours 18 minutes 34 seconds  Total Procedure Duration: 0 hours 23 minutes 41 seconds  Findings:                 The perianal and digital rectal examinations were                            normal.                           A 3 mm polyp was found in the ascending colon. The                            polyp was sessile. The polyp was removed with a                            cold snare. Resection and retrieval were complete.                           A 4 mm polyp was found in the transverse colon. The                            polyp was sessile. The polyp was removed with a  cold snare. Resection and retrieval were complete.                           A 3 mm polyp was found in the sigmoid colon. The                            polyp was sessile. The polyp was removed with a                            cold snare. Resection and retrieval were complete.                           A 4 mm polyp was found in the recto-sigmoid colon.                            The polyp was sessile. The polyp was removed with a                            cold snare. Resection and retrieval were complete.                           A 3 mm polyp was found in the rectum. The polyp was                            sessile. The polyp was removed with a cold snare.                            Resection and retrieval were complete.                           Many medium-mouthed diverticula were found in the                            left colon.                            Internal hemorrhoids were found. The hemorrhoids                            were small.                           The colon did not retain air well in the left colon                            which prolonged this exam. The exam was otherwise                            without abnormality. Complications:            No immediate complications. Estimated blood loss:                            Minimal. Estimated Blood  Loss:     Estimated blood loss was minimal. Impression:               - One 3 mm polyp in the ascending colon, removed                            with a cold snare. Resected and retrieved.                           - One 4 mm polyp in the transverse colon, removed                            with a cold snare. Resected and retrieved.                           - One 3 mm polyp in the sigmoid colon, removed with                            a cold snare. Resected and retrieved.                           - One 4 mm polyp at the recto-sigmoid colon,                            removed with a cold snare. Resected and retrieved.                           - One 3 mm polyp in the rectum, removed with a cold                            snare. Resected and retrieved.                           - Diverticulosis in the left colon.                           - Internal hemorrhoids.                           - Poor air retention in the left colon                           - The examination was otherwise normal. Recommendation:           - Patient has a contact number available for                            emergencies. The signs and symptoms of potential                            delayed complications were discussed with the                            patient. Return to normal activities tomorrow.  Written discharge instructions were provided to the                            patient.                           - Resume previous diet.                           - Continue  present medications.                           - Await pathology results.                           - Repeat colonoscopy is recommended for                            surveillance. The colonoscopy date will be                            determined after pathology results from today's                            exam become available for review.                           - No ibuprofen, naproxen, or other non-steroidal                            anti-inflammatory drugs for 2 weeks after polyp                            removal. Remo Lipps P. Armbruster MD, MD 02/27/2017 9:30:38 AM This report has been signed electronically.

## 2017-02-27 NOTE — Patient Instructions (Addendum)
YOU HAD AN ENDOSCOPIC PROCEDURE TODAY AT [facility]:   Refer to the procedure report that was given to you for any specific questions about what was found during the examination.  If the procedure report does not answer your questions, please call your gastroenterologist to clarify.  If you requested that your care partner not be given the details of your procedure findings, then the procedure report has been included in a sealed envelope for you to review at your convenience later.  YOU SHOULD EXPECT: Some feelings of bloating in the abdomen. Passage of more gas than usual.  Walking can help get rid of the air that was put into your GI tract during the procedure and reduce the bloating. If you had a lower endoscopy (such as a colonoscopy or flexible sigmoidoscopy) you may notice spotting of blood in your stool or on the toilet paper. If you underwent a bowel prep for your procedure, you may not have a normal bowel movement for a few days.  Please Note:  You might notice some irritation and congestion in your nose or some drainage.  This is from the oxygen used during your procedure.  There is no need for concern and it should clear up in a day or so.  SYMPTOMS TO REPORT IMMEDIATELY:   Following lower endoscopy (colonoscopy or flexible sigmoidoscopy):  Excessive amounts of blood in the stool  Significant tenderness or worsening of abdominal pains  Swelling of the abdomen that is new, acute  Fever of 100F or higher   For urgent or emergent issues, a gastroenterologist can be reached at any hour by calling [phone].  Please read all handouts given to you by your recovery nurse.  No ASA, Ibuprofen or NSAIDS for 2 weeks.  DIET:  We do recommend a small meal at first, but then you may proceed to your regular diet.  Drink plenty of fluids but you should avoid alcoholic beverages for 24 hours.  ACTIVITY:  You should plan to take it easy for the rest of today and you should  NOT DRIVE or use heavy machinery until tomorrow (because of the sedation medicines used during the test).    FOLLOW UP: Our staff will call the number listed on your records the next business day following your procedure to check on you and address any questions or concerns that you may have regarding the information given to you following your procedure. If we do not reach you, we will leave a message.  However, if you are feeling well and you are not experiencing any problems, there is no need to return our call.  We will assume that you have returned to your regular daily activities without incident.  If any biopsies were taken you will be contacted by phone or by letter within the next 1-3 weeks.  Please call us at [phone] if you have not heard about the biopsies in 3 weeks.    SIGNATURES/CONFIDENTIALITY: You and/or your care partner have signed paperwork which will be entered into your electronic medical record.  These signatures attest to the fact that that the information above on your After Visit Summary has been reviewed and is understood.  Full responsibility of the confidentiality of this discharge information lies with you and/or your care-partner.  Thank you for letting us take care of your healthcare needs today.

## 2017-02-27 NOTE — Progress Notes (Signed)
Transfer to PACU VSS, Report to RN

## 2017-02-27 NOTE — Progress Notes (Signed)
Called to room to assist during endoscopic procedure.  Patient ID and intended procedure confirmed with present staff. Received instructions for my participation in the procedure from the performing physician.  

## 2017-03-02 ENCOUNTER — Telehealth: Payer: Self-pay | Admitting: *Deleted

## 2017-03-02 NOTE — Telephone Encounter (Signed)
No answer. Number identifier. Message left to call if questions or concerns and that we would also attempt to call later in the day.

## 2017-03-02 NOTE — Telephone Encounter (Signed)
No answer. Number identifier. Message left to call if questions or concerns. 

## 2017-03-12 ENCOUNTER — Encounter: Payer: Self-pay | Admitting: Gastroenterology

## 2017-10-23 ENCOUNTER — Emergency Department (HOSPITAL_BASED_OUTPATIENT_CLINIC_OR_DEPARTMENT_OTHER): Payer: Worker's Compensation

## 2017-10-23 ENCOUNTER — Emergency Department (HOSPITAL_BASED_OUTPATIENT_CLINIC_OR_DEPARTMENT_OTHER)
Admission: EM | Admit: 2017-10-23 | Discharge: 2017-10-23 | Disposition: A | Discharge status: Home / Self Care | Payer: Worker's Compensation | Attending: Emergency Medicine | Admitting: Emergency Medicine

## 2017-10-23 ENCOUNTER — Encounter (HOSPITAL_BASED_OUTPATIENT_CLINIC_OR_DEPARTMENT_OTHER): Payer: Self-pay | Admitting: Emergency Medicine

## 2017-10-23 ENCOUNTER — Other Ambulatory Visit: Payer: Self-pay

## 2017-10-23 DIAGNOSIS — Y990 Civilian activity done for income or pay: Secondary | ICD-10-CM | POA: Insufficient documentation

## 2017-10-23 DIAGNOSIS — S0003XA Contusion of scalp, initial encounter: Secondary | ICD-10-CM | POA: Diagnosis not present

## 2017-10-23 DIAGNOSIS — F17210 Nicotine dependence, cigarettes, uncomplicated: Secondary | ICD-10-CM | POA: Diagnosis not present

## 2017-10-23 DIAGNOSIS — S0001XA Abrasion of scalp, initial encounter: Secondary | ICD-10-CM

## 2017-10-23 DIAGNOSIS — Y929 Unspecified place or not applicable: Secondary | ICD-10-CM | POA: Insufficient documentation

## 2017-10-23 DIAGNOSIS — Z7982 Long term (current) use of aspirin: Secondary | ICD-10-CM | POA: Insufficient documentation

## 2017-10-23 DIAGNOSIS — Y9389 Activity, other specified: Secondary | ICD-10-CM | POA: Insufficient documentation

## 2017-10-23 DIAGNOSIS — W208XXA Other cause of strike by thrown, projected or falling object, initial encounter: Secondary | ICD-10-CM | POA: Insufficient documentation

## 2017-10-23 DIAGNOSIS — S0101XA Laceration without foreign body of scalp, initial encounter: Secondary | ICD-10-CM | POA: Diagnosis not present

## 2017-10-23 DIAGNOSIS — S0990XA Unspecified injury of head, initial encounter: Secondary | ICD-10-CM | POA: Diagnosis present

## 2017-10-23 DIAGNOSIS — Z79899 Other long term (current) drug therapy: Secondary | ICD-10-CM | POA: Insufficient documentation

## 2017-10-23 NOTE — ED Triage Notes (Addendum)
Per EMS patient was at work and patient was trying to open crate with crowbar that was above his head.  States that he injured himself with the crowbar when pulling on crate.  Patient has laceration to right side of head.  Denies LOC, c/o headache.

## 2017-10-23 NOTE — ED Notes (Signed)
Unable to do a UDS due to pt not having any Photo ID with him. Pt states he will come back later after discharge with it for a UDS, as no one can bring him his wallet.

## 2017-10-23 NOTE — ED Provider Notes (Signed)
[facility] EMERGENCY DEPARTMENT Provider Note   CSN: [id number] Arrival date & time: 10/23/17  1102     History   Chief Complaint Chief Complaint  Patient presents with  . Head Injury    HPI Jacob Yoder is a 61 y.o. male.  The history is provided by the patient and the EMS personnel. No language interpreter was used.  Head Injury      Jacob Yoder is a 61 y.o. male who presents to the Emergency Department complaining of head injury.  He presents via EMS for evaluation following a head injury.  He was opening a crate with a crowbar.  He was doing this above his head when something gave way and the crowbar hit him in the right side of the head.  He did not lose consciousness but feels off.  He did have moderate bleeding on the scene that has since stopped.  No nausea, vomiting.  He does have a mild headache.  No numbness, weakness.  He does take a baby aspirin daily.  Last tetanus shot was about 5 years ago.  Past Medical History:  Diagnosis Date  . DDD (degenerative disc disease), lumbar   . Hyperlipidemia   . Smoker     Patient Active Problem List   Diagnosis Date Noted  . DOE (dyspnea on exertion) 03/02/2015  . Atypical chest pain 03/02/2015  . Hyperlipidemia   . DDD (degenerative disc disease), lumbar   . Smoker     Past Surgical History:  Procedure Laterality Date  . COLONOSCOPY W/ POLYPECTOMY    . HAND SURGERY     right; ring finger amputated  . WISDOM TOOTH EXTRACTION         Home Medications    Prior to Admission medications   Medication Sig Start Date End Date Taking? Authorizing Provider  aspirin 81 MG tablet Take 81 mg by mouth daily.    [provider]  celecoxib (CELEBREX) 200 MG capsule Take 1 capsule (200 mg total) by mouth daily. 12/26/16   Susy Frizzle, MD  fluticasone (FLONASE) 50 MCG/ACT nasal spray Place 1 spray into both nostrils daily.    [provider]  Multiple Vitamin (MULTIVITAMIN) tablet Take 1  tablet by mouth daily.    [provider]  simvastatin (ZOCOR) 40 MG tablet Take 1 tablet (40 mg total) by mouth at bedtime. Patient taking differently: Take 20 mg by mouth at bedtime.  12/26/16   Susy Frizzle, MD    Family History Family History  Problem Relation Age of Onset  . Heart disease Paternal Grandfather   . Hyperlipidemia Mother   . Heart disease Father   . Cancer Father   . Hypertension Sister   . Hyperlipidemia Sister   . Colon cancer Neg Hx     Social History Social History   Tobacco Use  . Smoking status: Current Every Day Smoker    Packs/day: 0.50    Years: 35.00    Pack years: 17.50    Types: Cigarettes  . Smokeless tobacco: Never Used  Substance Use Topics  . Alcohol use: Yes    Comment: weekends only  . Drug use: No     Allergies   Patient has no known allergies.   Review of Systems Review of Systems  All other systems reviewed and are negative.    Physical Exam Updated Vital Signs BP (!) 141/93 (BP Location: Right Arm)   Pulse 68   Temp 98.7 F (37.1 C) (  Oral)   Resp 18   Ht 5\' 7"  (1.702 m)   Wt 70.3 kg (155 lb)   SpO2 96%   BMI 24.28 kg/m   Physical Exam  Constitutional: He is oriented to person, place, and time. He appears well-developed and well-nourished.  HENT:  Head: Normocephalic.  Mild swelling to the right parietal scalp.  There is a small overlying laceration that is hemostatic.  Eyes: EOM are normal. Pupils are equal, round, and reactive to light.  Neck: Neck supple.  Cardiovascular: Normal rate and regular rhythm.  No murmur heard. Pulmonary/Chest: Effort normal and breath sounds normal. No respiratory distress.  Abdominal: Soft. There is no tenderness.  Musculoskeletal: Normal range of motion.  Neurological: He is alert and oriented to person, place, and time.  5 out of 5 strength in all 4 extremities with sensation light touch intact in all 4 extremities.  Mildly ataxic with ambulation.  Skin: Skin  is warm and dry. Capillary refill takes less than 2 seconds.  Psychiatric: He has a normal mood and affect. His behavior is normal.  Nursing note and vitals reviewed.    ED Treatments / Results  Labs (all labs ordered are listed, but only abnormal results are displayed) Labs Reviewed - No data to display  EKG  EKG Interpretation None       Radiology Ct Head Wo Contrast  Result Date: 10/23/2017 CLINICAL DATA:  Per EMS patient was at work and patient was trying to open crate with crowbar that was above his head. States that he injured himself with the crowbar when pulling on crate. Patient has laceration to right side of head. Denies LOC, c/o headache. EXAM: CT HEAD WITHOUT CONTRAST TECHNIQUE: Contiguous axial images were obtained from the base of the skull through the vertex without intravenous contrast. COMPARISON:  None. FINDINGS: Brain: No evidence of acute infarction, hemorrhage, hydrocephalus, extra-axial collection or mass lesion/mass effect. Vascular: No hyperdense vessel or unexpected calcification. Skull: Normal. Negative for fracture or focal lesion. Sinuses/Orbits: Globes and orbits are unremarkable. There is mild ethmoid and inferior frontal sinus mucosal thickening. Mastoid air cells are clear. Other: None. IMPRESSION: 1. No intracranial abnormality. 2. No skull fracture. Electronically Signed   By: Lajean Manes M.D.   On: 10/23/2017 11:47    Procedures .Marland KitchenLaceration Repair Date/Time: 10/23/2017 2:43 PM Performed by: Quintella Reichert, MD Authorized by: Quintella Reichert, MD   Consent:    Consent obtained:  Verbal   Consent given by:  Patient   Risks discussed:  Infection   Alternatives discussed:  No treatment Anesthesia (see MAR for exact dosages):    Anesthesia method:  None Laceration details:    Location:  Scalp   Scalp location:  R parietal   Length (cm):  1 Repair type:    Repair type:  Simple Exploration:    Contaminated: no   Treatment:    Area cleansed  with:  Saline and Betadine Skin repair:    Repair method:  Tissue adhesive Post-procedure details:    Dressing:  Open (no dressing)   (including critical care time)  Medications Ordered in ED Medications - No data to display   Initial Impression / Assessment and Plan / ED Course  I have reviewed the triage vital signs and the nursing notes.  Pertinent labs & imaging results that were available during my care of the patient were reviewed by me and considered in my medical decision making (see chart for details).     For evaluation of head injury.  He does have a very superficial laceration and contusion to the right parietal scalp.  CT head is negative for acute intracranial abnormality.  On repeat assessment in the emergency department his dizziness and difficulty walking has resolved.  Counseled patient on home care for head contusion, head injury.  Discussed outpatient follow-up and return precautions.  Final Clinical Impressions(s) / ED Diagnoses   Final diagnoses:  Abrasion of scalp, initial encounter  Contusion of scalp, initial encounter    ED Discharge Orders    None       Quintella Reichert, MD 10/23/17 1445

## 2017-10-23 NOTE — ED Notes (Signed)
Pt supervisor is at bedside to identify pt for UDS.

## 2017-10-23 NOTE — ED Notes (Signed)
ED Provider at bedside. 

## 2018-01-12 ENCOUNTER — Other Ambulatory Visit: Payer: Self-pay | Admitting: Family Medicine

## 2018-03-22 ENCOUNTER — Encounter (HOSPITAL_COMMUNITY): Payer: Self-pay | Admitting: Emergency Medicine

## 2018-03-22 ENCOUNTER — Other Ambulatory Visit: Payer: Self-pay

## 2018-03-22 ENCOUNTER — Emergency Department (HOSPITAL_COMMUNITY)
Admission: EM | Admit: 2018-03-22 | Discharge: 2018-03-22 | Disposition: A | Discharge status: Home / Self Care | Payer: 59 | Attending: Emergency Medicine | Admitting: Emergency Medicine

## 2018-03-22 ENCOUNTER — Emergency Department (HOSPITAL_COMMUNITY): Payer: 59

## 2018-03-22 ENCOUNTER — Telehealth: Payer: Self-pay | Admitting: Family Medicine

## 2018-03-22 DIAGNOSIS — H538 Other visual disturbances: Secondary | ICD-10-CM | POA: Insufficient documentation

## 2018-03-22 DIAGNOSIS — Z5321 Procedure and treatment not carried out due to patient leaving prior to being seen by health care provider: Secondary | ICD-10-CM | POA: Insufficient documentation

## 2018-03-22 DIAGNOSIS — R200 Anesthesia of skin: Secondary | ICD-10-CM | POA: Insufficient documentation

## 2018-03-22 LAB — COMPREHENSIVE METABOLIC PANEL
ALK PHOS: 67 U/L (ref 38–126)
ALT: 20 U/L (ref 17–63)
ANION GAP: 11 (ref 5–15)
AST: 23 U/L (ref 15–41)
Albumin: 4.2 g/dL (ref 3.5–5.0)
BUN: 17 mg/dL (ref 6–20)
CHLORIDE: 103 mmol/L (ref 101–111)
CO2: 24 mmol/L (ref 22–32)
Calcium: 9.4 mg/dL (ref 8.9–10.3)
Creatinine, Ser: 0.84 mg/dL (ref 0.61–1.24)
GFR calc non Af Amer: >60 mL/min (ref >60)
Glucose, Bld: 116 mg/dL — ABNORMAL HIGH (ref 65–99)
Potassium: 4.4 mmol/L (ref 3.5–5.1)
SODIUM: 138 mmol/L (ref 135–145)
Total Bilirubin: 0.6 mg/dL (ref 0.3–1.2)
Total Protein: 7.1 g/dL (ref 6.5–8.1)

## 2018-03-22 LAB — DIFFERENTIAL
ABS IMMATURE GRANULOCYTES: 0 10*3/uL (ref 0.0–0.1)
Basophils Absolute: 0 10*3/uL (ref 0.0–0.1)
Basophils Relative: 1 %
EOS PCT: 3 %
Eosinophils Absolute: 0.2 10*3/uL (ref 0.0–0.7)
Immature Granulocytes: 1 %
LYMPHS ABS: 1.6 10*3/uL (ref 0.7–4.0)
LYMPHS PCT: 29 %
MONOS PCT: 8 %
Monocytes Absolute: 0.4 10*3/uL (ref 0.1–1.0)
NEUTROS ABS: 3.3 10*3/uL (ref 1.7–7.7)
Neutrophils Relative %: 58 %

## 2018-03-22 LAB — I-STAT CHEM 8, ED
BUN: 20 mg/dL (ref 6–20)
CHLORIDE: 104 mmol/L (ref 101–111)
Calcium, Ion: 1.2 mmol/L (ref 1.15–1.40)
Creatinine, Ser: 0.7 mg/dL (ref 0.61–1.24)
Glucose, Bld: 111 mg/dL — ABNORMAL HIGH (ref 65–99)
HEMATOCRIT: 49 % (ref 39.0–52.0)
Hemoglobin: 16.7 g/dL (ref 13.0–17.0)
POTASSIUM: 4.4 mmol/L (ref 3.5–5.1)
SODIUM: 139 mmol/L (ref 135–145)
TCO2: 24 mmol/L (ref 22–32)

## 2018-03-22 LAB — CBC
HEMATOCRIT: 48.2 % (ref 39.0–52.0)
HEMOGLOBIN: 16.2 g/dL (ref 13.0–17.0)
MCH: 34.3 pg — AB (ref 26.0–34.0)
MCHC: 33.6 g/dL (ref 30.0–36.0)
MCV: 102.1 fL — AB (ref 78.0–100.0)
Platelets: 250 10*3/uL (ref 150–400)
RBC: 4.72 MIL/uL (ref 4.22–5.81)
RDW: 12.2 % (ref 11.5–15.5)
WBC: 5.5 10*3/uL (ref 4.0–10.5)

## 2018-03-22 LAB — I-STAT TROPONIN, ED: Troponin i, poc: 0.01 ng/mL (ref 0.00–0.08)

## 2018-03-22 LAB — PROTIME-INR
INR: 1.01
PROTHROMBIN TIME: 13.2 s (ref 11.4–15.2)

## 2018-03-22 LAB — APTT: aPTT: 29 seconds (ref 24–36)

## 2018-03-22 NOTE — ED Triage Notes (Signed)
Pt reports Friday he had an episode of blurred vision and numbness in his mouth (bilateral). Reports numbness has happened a few times over the past 4-6 weeks. Pt spoke with triage nurse at his pcp office who referred him to the ED. Speech clear, face symmetrical, a/ox4, grip strength equal bilaterally. Pt denies any symptoms at this time.

## 2018-03-22 NOTE — Telephone Encounter (Signed)
Patient walked into the practice this morning with c/o facial numbness, tingling, and visual disturbance, loss of balance. Patient states that symptoms occurred on Friday and last about 4 minutes. Patient states that he has had symptoms of facial tingling in the past but not the severity that it was on Friday. He advised me that he spoke with a family member whom is a PA that had concerns that he may have been having a T.I.A.  Patient also informed he has a strong family hx of cardiovascular disease stating that he father and his paternal grandfather both had heart attacks. Advised patient with his symptoms that he needed to be evaluated at the ER. Patient verbalized understanding and stated that he will go to [facility].

## 2018-03-23 ENCOUNTER — Emergency Department (HOSPITAL_COMMUNITY)
Admission: EM | Admit: 2018-03-23 | Discharge: 2018-03-23 | Disposition: A | Discharge status: Home / Self Care | Payer: 59 | Attending: Emergency Medicine | Admitting: Emergency Medicine

## 2018-03-23 ENCOUNTER — Encounter (HOSPITAL_COMMUNITY): Payer: Self-pay | Admitting: Emergency Medicine

## 2018-03-23 ENCOUNTER — Emergency Department (HOSPITAL_COMMUNITY): Payer: 59

## 2018-03-23 DIAGNOSIS — R2689 Other abnormalities of gait and mobility: Secondary | ICD-10-CM | POA: Diagnosis not present

## 2018-03-23 DIAGNOSIS — I671 Cerebral aneurysm, nonruptured: Secondary | ICD-10-CM | POA: Diagnosis not present

## 2018-03-23 DIAGNOSIS — F17210 Nicotine dependence, cigarettes, uncomplicated: Secondary | ICD-10-CM | POA: Insufficient documentation

## 2018-03-23 DIAGNOSIS — R200 Anesthesia of skin: Secondary | ICD-10-CM | POA: Diagnosis present

## 2018-03-23 MED ORDER — IOPAMIDOL (ISOVUE-370) INJECTION 76%
50.0000 mL | Freq: Once | INTRAVENOUS | Status: AC | PRN
  Administered 2018-03-23: 50 mL via INTRAVENOUS

## 2018-03-23 NOTE — ED Triage Notes (Signed)
Pt back today to follow up on his blood work and Ct scan he had done yesterday

## 2018-03-23 NOTE — ED Provider Notes (Signed)
I assumed care of this patient from Dr. Laverta Baltimore at 1600.  Please see their note for further details of Hx, PE.  Briefly patient is a 62 y.o. male who presented with brief episodes of lip, tongue numbness and vision changes.  Last episode 4 days ago.  TIA work-up was obtained and an MRI revealed aneurysms.  CTA confirmed them.  Neurology consulted who evaluated the patient and recommended outpatient neurosurgery follow-up..   The patient is safe for discharge with strict return precautions.  Disposition: Discharge  Condition: Good  I have discussed the results, Dx and Tx plan with the patient who expressed understanding and agree(s) with the plan. Discharge instructions discussed at great length. The patient was given strict return precautions who verbalized understanding of the instructions. No further questions at time of discharge.    ED Discharge Orders    None       Follow Up: Susy Frizzle, MD [address] [place] [address] [phone]  Call today   Luanne Bras, MD [address] [place] [address] [phone]  Call in 1 day to follow up regarding aneurysm        Denilson Salminen, Grayce Sessions, MD 03/23/18 1746

## 2018-03-23 NOTE — ED Notes (Signed)
Pt transported to MRI 

## 2018-03-23 NOTE — ED Notes (Signed)
Nurse first called MRI. Patient currently having MRI at this time.

## 2018-03-23 NOTE — ED Notes (Signed)
Pt called to have vitals reassessed. Will call again.

## 2018-03-23 NOTE — ED Provider Notes (Signed)
Emergency Department Provider Note   I have reviewed the triage vital signs and the nursing notes.   HISTORY  Chief Complaint Transient Ischemic Attack   HPI Jacob Yoder is a 62 y.o. male with PMH of HLD and tobacco use resents to the emergency department for evaluation of intermittent strokelike symptoms over the past several weeks.  The patient states that he has brief episodes of lip numbness, tongue numbness, and vision changes.  He had a severe episode 4 days ago which lasted approximately 4 minutes.  He states that his vision became very blurry and he felt numb in his face.  He also had balance issues feeling like he was going to fall over if he did not brace himself.  He denies any upper or lower extremity weakness/numbness.  He did not notice any speech changes or difficulty swallowing.  He has no history of TIA or stroke.  Patient presented to the emergency department yesterday where labs were ordered along with CT head which were normal.  The patient left the emergency department prior to evaluation with plan to return today. No additional episodes since Friday.   Past Medical History:  Diagnosis Date  . DDD (degenerative disc disease), lumbar   . Hyperlipidemia   . Smoker     Patient Active Problem List   Diagnosis Date Noted  . DOE (dyspnea on exertion) 03/02/2015  . Atypical chest pain 03/02/2015  . Hyperlipidemia   . DDD (degenerative disc disease), lumbar   . Smoker     Past Surgical History:  Procedure Laterality Date  . COLONOSCOPY W/ POLYPECTOMY    . HAND SURGERY     right; ring finger amputated  . WISDOM TOOTH EXTRACTION      Current Outpatient Rx  . Order #: [id number] Class: Historical Med  . Order #: [id number] Class: Normal  . Order #: [id number] Class: Normal  . Order #: [id number] Class: Historical Med  . Order #: [id number] Class: Historical Med  . Order #: [id number] Class: Normal    Allergies Patient has no known allergies.  Family History    Problem Relation Age of Onset  . Heart disease Paternal Grandfather   . Hyperlipidemia Mother   . Heart disease Father   . Cancer Father   . Hypertension Sister   . Hyperlipidemia Sister   . Colon cancer Neg Hx     Social History Social History   Tobacco Use  . Smoking status: Current Every Day Smoker    Packs/day: 0.50    Years: 35.00    Pack years: 17.50    Types: Cigarettes  . Smokeless tobacco: Never Used  Substance Use Topics  . Alcohol use: Yes    Comment: weekends only  . Drug use: No    Review of Systems  Constitutional: No fever/chills Eyes: No visual changes. ENT: No sore throat. Cardiovascular: Denies chest pain. Respiratory: Denies shortness of breath. Gastrointestinal: No abdominal pain.  No nausea, no vomiting.  No diarrhea.  No constipation. Genitourinary: Negative for dysuria. Musculoskeletal: Negative for back pain. Skin: Negative for rash. Neurological: Negative for headaches. Positive face weakness/numbness with vision changes and balance difficulty (intermittent).   10-point ROS otherwise negative.  ____________________________________________   PHYSICAL EXAM:  VITAL SIGNS: ED Triage Vitals [03/23/18 0808]  Enc Vitals Group     BP (!) 135/98     Pulse Rate 100     Resp 18     Temp 98.1 F (36.7 C)     Temp Source Oral  SpO2 98 %   Constitutional: Alert and oriented. Well appearing and in no acute distress. Eyes: Conjunctivae are normal. PERRL. EOMI. Head: Atraumatic. Nose: No congestion/rhinnorhea. Mouth/Throat: Mucous membranes are moist.  Oropharynx non-erythematous. Neck: No stridor.  Cardiovascular: Normal rate, regular rhythm. Good peripheral circulation. Grossly normal heart sounds.   Respiratory: Normal respiratory effort.  No retractions. Lungs CTAB. Gastrointestinal: Soft and nontender. No distention.  Musculoskeletal: No lower extremity tenderness nor edema. No gross deformities of extremities. Neurologic:  Normal  speech and language. No gross focal neurologic deficits are appreciated. Normal CN exam 2-12. No pronator drift. Normal finger-to-nose testing. Normal gait.  Skin:  Skin is warm, dry and intact. No rash noted.  ____________________________________________   LABS (all labs ordered are listed, but only abnormal results are displayed)  Labs Reviewed - No data to display ____________________________________________  EKG  EKG reviewed from yesterday.   NSR. Normal intervals. No ST elevation or depression. Normal T waves. No STEMI.  ____________________________________________  RADIOLOGY  Ct Angio Head W Or Wo Contrast  Result Date: 03/23/2018 CLINICAL DATA:  Arterial stricture/occlusion, head neck Patient presented with blurriness for 2 days and perioral numbness. A brain MRI showed right MCA aneurysm. EXAM: CT ANGIOGRAPHY HEAD AND NECK TECHNIQUE: Multidetector CT imaging of the head and neck was performed using the standard protocol during bolus administration of intravenous contrast. Multiplanar CT image reconstructions and MIPs were obtained to evaluate the vascular anatomy. Carotid stenosis measurements (when applicable) are obtained utilizing NASCET criteria, using the distal internal carotid diameter as the denominator. CONTRAST:  72mL ISOVUE-370 IOPAMIDOL (ISOVUE-370) INJECTION 76% COMPARISON:  Head CT from yesterday.  Brain MRI from earlier today. FINDINGS: CTA NECK FINDINGS Aortic arch: Atherosclerotic calcification. Three vessel branching. No dilatation or dissection. Right carotid system: Vessels are smooth and diffusely patent. No noted atheromatous changes. Left carotid system: Vessels are smooth and diffusely patent. Mild atherosclerotic plaque at the ICA bulb and common carotid origin. Vertebral arteries: Left subclavian origin atherosclerosis. No subclavian stenosis. The left vertebral artery is strongly dominant. When accounting for streak artifactand mild deformity by osteophytes  the vertebral arteries are smooth and diffusely patent. Skeleton: Diffuse cervical disc degeneration with high-grade narrowing from C3-4 to C6-7. No acute or aggressive finding. Other neck: No acute finding. Upper chest: No acute finding. Few tiny nodular densities in the right upper lobe are clustered, suggesting inflammatory process. Review of the MIP images confirms the above findings CTA HEAD FINDINGS Anterior circulation: Overall mild atherosclerotic plaque on the carotid siphons. Hypoplastic right A1 segment. There is a superior and anteriorly directed right MCA bifurcation aneurysm with pointed appearance at the apex. The aneurysm has a broad neck. In maximal span the sac measures 6 mm. See 3D reformats. 2 mm outpouching from the right supraclinoid ICA. The origin of the posterior communicating artery arises from or near the sac. Posterior circulation: Tiny right vertebral artery with minimal contribution to the basilar. Proximal basilar fenestration. No branch occlusion or beading. Negative for aneurysm. Venous sinuses: Patent Anatomic variants: As above Delayed phase: No abnormal intracranial enhancement. Review of the MIP images confirms the above findings IMPRESSION: 1. 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 The sac is pointed. No acute hemorrhage. 2. 2 mm right posterior communicating aneurysm or infundibulum. 3. Overall mild atherosclerosis.  No flow limiting stenosis. Electronically Signed   By: Monte Fantasia M.D.   On: 03/23/2018 16:01   Ct Angio Neck W And/or Wo Contrast  Result Date: 03/23/2018 CLINICAL DATA:  Arterial stricture/occlusion, head neck Patient presented with blurriness for 2 days and perioral numbness. A brain MRI showed right MCA aneurysm. EXAM: CT ANGIOGRAPHY HEAD AND NECK TECHNIQUE: Multidetector CT imaging of the head and neck was performed using the standard protocol during bolus administration of intravenous contrast. Multiplanar CT image reconstructions and  MIPs were obtained to evaluate the vascular anatomy. Carotid stenosis measurements (when applicable) are obtained utilizing NASCET criteria, using the distal internal carotid diameter as the denominator. CONTRAST:  70mL ISOVUE-370 IOPAMIDOL (ISOVUE-370) INJECTION 76% COMPARISON:  Head CT from yesterday.  Brain MRI from earlier today. FINDINGS: CTA NECK FINDINGS Aortic arch: Atherosclerotic calcification. Three vessel branching. No dilatation or dissection. Right carotid system: Vessels are smooth and diffusely patent. No noted atheromatous changes. Left carotid system: Vessels are smooth and diffusely patent. Mild atherosclerotic plaque at the ICA bulb and common carotid origin. Vertebral arteries: Left subclavian origin atherosclerosis. No subclavian stenosis. The left vertebral artery is strongly dominant. When accounting for streak artifactand mild deformity by osteophytes the vertebral arteries are smooth and diffusely patent. Skeleton: Diffuse cervical disc degeneration with high-grade narrowing from C3-4 to C6-7. No acute or aggressive finding. Other neck: No acute finding. Upper chest: No acute finding. Few tiny nodular densities in the right upper lobe are clustered, suggesting inflammatory process. Review of the MIP images confirms the above findings CTA HEAD FINDINGS Anterior circulation: Overall mild atherosclerotic plaque on the carotid siphons. Hypoplastic right A1 segment. There is a superior and anteriorly directed right MCA bifurcation aneurysm with pointed appearance at the apex. The aneurysm has a broad neck. In maximal span the sac measures 6 mm. See 3D reformats. 2 mm outpouching from the right supraclinoid ICA. The origin of the posterior communicating artery arises from or near the sac. Posterior circulation: Tiny right vertebral artery with minimal contribution to the basilar. Proximal basilar fenestration. No branch occlusion or beading. Negative for aneurysm. Venous sinuses: Patent Anatomic  variants: As above Delayed phase: No abnormal intracranial enhancement. Review of the MIP images confirms the above findings IMPRESSION: 1. 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 The sac is pointed. No acute hemorrhage. 2. 2 mm right posterior communicating aneurysm or infundibulum. 3. Overall mild atherosclerosis.  No flow limiting stenosis. Electronically Signed   By: Monte Fantasia M.D.   On: 03/23/2018 16:01   Mr Brain Wo Contrast  Result Date: 03/23/2018 CLINICAL DATA:  Blurry vision 2 days ago.  Perioral numbness. EXAM: MRI HEAD WITHOUT CONTRAST TECHNIQUE: Multiplanar, multiecho pulse sequences of the brain and surrounding structures were obtained without intravenous contrast. COMPARISON:  CT head 03/22/2018. FINDINGS: Brain: No acute infarction, hemorrhage, hydrocephalus, extra-axial collection or mass lesion. Borderline atrophy. T2 and FLAIR hyperintensities throughout the periventricular and subcortical white matter, likely chronic microvascular ischemic change. Vascular: Flow voids are maintained. There is bulbous enlargement at the RIGHT MCA trifurcation/bifurcation, 5 x 6 mm, potentially representing a saccular aneurysm. No similar findings elsewhere. Skull and upper cervical spine: Normal marrow signal. Sinuses/Orbits: No layering fluid. BILATERAL ethmoid mucosal thickening. Negative orbits. Other: Trace LEFT mastoid fluid, non worrisome. IMPRESSION: Borderline atrophy.  Mild chronic microvascular ischemic change. No acute stroke, visible hemorrhage, or mass lesion. Bulbous enlargement at the RIGHT MCA bifurcation/trifurcation, could represent a saccular aneurysm. CTA head neck recommended for further evaluation. Electronically Signed   By: Staci Righter M.D.   On: 03/23/2018 11:03    ____________________________________________   PROCEDURES  Procedure(s) performed:   Procedures  None ____________________________________________   INITIAL IMPRESSION /  ASSESSMENT AND  PLAN / ED COURSE  Pertinent labs & imaging results that were available during my care of the patient were reviewed by me and considered in my medical decision making (see chart for details).  Patient presents to the emergency department for evaluation of episodic neurological symptoms which are primarily focused in the cranial nerve distribution.  He has no focal neurologic deficits on my exam.  His last episode was 4 days prior.  I reviewed the patient's lab work, CT head, EKG performed yesterday evening while the patient was in the waiting room.  No acute findings on this testing.  Plan for MRI today for further evaluation.   MRI with no CVA but concern for possible aneurysm. CTA head/neck obtained which confirmed the diagnosis. No focal deficits or HA symptoms. Spoke with Neurology who will evaluate the patient in the ED. Care transferred to Dr. Leonette Monarch who will f/u recommendation but discussed follow up with NSG and/or Neuro IR for further mgmt of the aneurysm.    ____________________________________________  FINAL CLINICAL IMPRESSION(S) / ED DIAGNOSES  Final diagnoses:  Aneurysm of middle cerebral artery  Balance problems     MEDICATIONS GIVEN DURING THIS VISIT:  Medications  iopamidol (ISOVUE-370) 76 % injection 50 mL (50 mLs Intravenous Contrast Given 03/23/18 1536)    Note:  This document was prepared using Dragon voice recognition software and may include unintentional dictation errors.  Nanda Quinton, MD Emergency Medicine    Uilani Sanville, [facility], MD 03/23/18 [phone]

## 2018-03-23 NOTE — Discharge Instructions (Signed)
You were seen in the ED today with balance issues. Your MRI was normal but you do have an aneurysm in the blood vessels in the brain. This will need to be evaluated by a interventional radiologist. I have provided the contact information. Return to the ED with return of any new or worsening symptoms.

## 2018-03-23 NOTE — Consult Note (Addendum)
Referring Physician: Margette Fast, MD    Chief Complaint: Transient Ischemic Attack   HPI: Jacob Yoder is an 62 y.o. male without any significant past medical history except for dyslipidemia tobacco abuse and palpitations presents the ED for evaluation of intermittent strokelike symptoms of jaw pain and  tongue numbness.  Per patient over 1 to 2 months ago he started having incidences of jaw numbness like anesthesia which would last for about a minute intermittently.  On Friday, 03/19/2018 while he was at work he had an incidence of the most intense jaw  numbness with associated lip numbness,, weakness, blurred vision, disequilibrium, inability to focus, he states his coworkers deny him having any episodes of dysarthria, facial droop or weakness of extremities.  This lasted for about 4 minutes and resolved altogether.  He presented actually to the ED yesterday for evaluation CT of the head was negative at that time.  He left AMA because he thought he was not being taken care of but he presented today again with encouragement from his sisters for further evaluation.    On assessment, MRI of the brain showed micro vascular ischemic changes, but no acute stroke.  CTA of the head and neck showed a 6 mm right MCA bifurcation aneurysm with a broad neck, no acute hemorrhage as well as a 2 mm right posterior communicating aneurysm or infundibulum.  States since Friday symptoms has since resolved, he denies any headache at this time or during incident, fevers chills recent head trauma, nausea or vomiting,, chest pain, shortness of breath, impaired vision or syncope.  Has been walking around the ER without difficulty without any focal deficits Labs and vitals are within normal limits LSN: Friday 5/17 tPA Given: No: No LVO NIHSS: 0  Baseline Premorbid modified Rankin scale (mRS): 0   Past Medical History:  Diagnosis Date  . DDD (degenerative disc disease), lumbar   . Hyperlipidemia   . Smoker     Past  Surgical History:  Procedure Laterality Date  . COLONOSCOPY W/ POLYPECTOMY    . HAND SURGERY     right; ring finger amputated  . WISDOM TOOTH EXTRACTION      Family History  Problem Relation Age of Onset  . Heart disease Paternal Grandfather   . Hyperlipidemia Mother   . Heart disease Father   . Cancer Father   . Hypertension Sister   . Hyperlipidemia Sister   . Colon cancer Neg Hx    Social History:  reports that he has been smoking cigarettes.  He has a 17.50 pack-year smoking history. He has never used smokeless tobacco. He reports that he drinks alcohol. He reports that he does not use drugs.  Allergies: No Known Allergies  Medications:   ROS: General: negative for - chills, fatigue, fever, night sweats, weight gain or weight loss Psychological  negative for - behavioral disorder, Ophthalmic : Meds to blurred vision  ENT: negative for - epistaxis, nasal discharge, vertigo, tinnitus Respiratory: negative for - cough, hemoptysis, shortness of breath or wheezing Cardiovascular: negative for - chest pain, dyspnea on exertion, edema or irregular heartbeat Gastrointestinal: negative for - abdominal pain, diarrhea, hematemesis, nausea/vomiting or stool incontinence Genito-Urinary: negative for - dysuria, hematuria, incontinence or urinary frequency/urgency Musculoskeletal: negative for - joint swelling or muscular weakness Neurological: as noted in HPI Dermatological: negative for rash and skin lesion changes  Physical Examination: Blood pressure 127/86, pulse 79, temperature 98.1 F (36.7 C), temperature source Oral, resp. rate 18, SpO2 99 %. HEENT-  Normocephalic, no lesions, without obvious abnormality.  Normal external eye and conjunctiva.   Cardiovascular- S1-S2 audible, pulses palpable throughout   Lungs-no rhonchi or wheezing noted, no excessive working breathing.  Saturations within normal limits Abdomen- All 4 quadrants palpated and nontender Musculoskeletal-no joint  tenderness, deformity or swelling Skin-warm and dry, no hyperpigmentation, vitiligo, or suspicious lesions  Neurological Examination Mental Status: Alert, oriented, thought content appropriate.  Speech fluent without evidence of aphasia.  Able to follow 3 step commands without difficulty. Cranial Nerves: II: Visual fields grossly normal,  III,IV, VI: ptosis not present, extra-ocular motions intact bilaterally pupils equal, round, reactive to light  V,VII: smile symmetric, facial light touch sensation normal bilaterally VIII: Hearing intact to voice IX,X: uvula rises symmetrically XI: bilateral shoulder shrug XII: midline tongue extension Motor: Right : Upper extremity   5/5    Left:     Upper extremity   5/5  Lower extremity   5/5     Lower extremity   5/5 Tone and bulk:normal tone throughout; no atrophy noted Sensory: Pinprick and light touch intact throughout, bilaterally Deep Tendon Reflexes: 2+ and symmetric throughout Plantars: Right: downgoing   Left: downgoing Cerebellar: normal finger-to-nose, normal rapid alternating movements and normal heel-to-shin test Gait: normal gait and station  NIHSS 1a Level of Conscious.: 0 1b LOC Questions: 0 1c LOC Commands: 0 2 Best Gaze: 0 3 Visual: 0 4 Facial Palsy: 0 5a Motor Arm - left: 0 5b Motor Arm - Right: 0 6a Motor Leg - Left: 0 6b Motor Leg - Right: 0 7 Limb Ataxia: 0 8 Sensory: 0 9 Best Language: 0 10 Dysarthria: 0 11 Extinct. and Inatten.: 0 TOTAL: 0   Ct Angio Head/NECK W Or Wo Contrast 03/23/2018 IMPRESSION:  1. 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 The sac is pointed. No acute hemorrhage.  2. 2 mm right posterior communicating aneurysm or infundibulum.  3. Overall mild atherosclerosis.  No flow limiting stenosis.   Ct Head Wo Contrast  03/22/2018 IMPRESSION:  No acute intracranial abnormality is noted. Electronically Signed   By: Inez Catalina M.D.   On: 03/22/2018 10:39   Borderline atrophy.   Mild chronic microvascular ischemic change. No acute stroke, visible hemorrhage, or mass lesion. Bulbous enlargement at the RIGHT MCA bifurcation/trifurcation, could represent a saccular aneurysm. CTA head neck recommended for further evaluation.   Assessment: 62 y.o. male without any significant past medical history except for dyslipidemia tobacco abuse and palpitations presents the ED for evaluation of intermittent strokelike symptoms of jaw pain and  tongue numbness.  Per patient over 1 to 2 months ago he started having incidences of jaw numbness like anesthesia which would last for about a minute intermittently.  Friday, 03/19/2018 he had an intense episode of jaw numbness with associated  Disequilibrium, tongue numbness, blurred vision, lip numbness which lasted about 4 minutes and has not had any further episodes since then.  CT a of the head and neck and MRI were concerning for a 6 mm right MCA aneurysm as well as a 2 mm right posterior communicating aneurysm.  1.  Right MCA bifurcation aneurysm and right posterior communicating aneurysm patient without history of stroke, has a history of tobacco abuse and dyslipidemia.  Symptoms of tongue numbness, jaw numbness, lip numbness, disequilibrium did not correlate with the location of the aneurysms.  And with the lack of focal neuro findings or localizing symtpoms, will recommend follow up with Neurosurgery .  Discussed symptoms of stroke and aneurysm  rupture with patient and to come to ER immediately when such symptoms occur.   2. Tobacco Abuse - discussed the importance of  tobacco cessation at this time, as it causes vasospasm of the artery which is dangerous and may lead to a rupture.  Pt verbalized understanding  3. HTN - BP control  Stroke Risk Factors - hypertension and smoking  Plan: -Follow-up outpatient with neurosurgery for evaluation of right MCA aneurysm and saccular aneurysm - Tobacco cessation -Follow up with PCP for tighter BP  control  Letha Cape DNP Neuro-hospitalist Team [phone] 03/23/2018, 5:16 PM   NEUROHOSPITALIST ADDENDUM Seen and examined the patient today. I have reviewed the contents of history and physical exam as documented by PA/ARNP/Resident and agree with above documentation.  I have discussed and formulated the above plan as documented. Edits to the note have been made as needed.    Karena Addison Booker Bhatnagar MD [facility] [phone]   If 7pm to 7am, please call on call as listed on AMION.

## 2018-03-24 ENCOUNTER — Telehealth: Payer: Self-pay | Admitting: Family Medicine

## 2018-03-24 DIAGNOSIS — I671 Cerebral aneurysm, nonruptured: Secondary | ICD-10-CM

## 2018-03-24 NOTE — Telephone Encounter (Signed)
Patient walked in to speak with me on today in regards to his ER follow up. He walked in also on Monday and I referred him to go to the ER. He had c/o facial numbness, and tingling, as well as abnormal vision and balance issues.  He went to the ER on Monday and Tuesday and on Tuesday was diagnosed with aneurysm of middle cerebral artery. On his discharged summary they recommend a referral to Neurology. They recommended Dr.Deveshar. May I put in a urgent referral for neurology. Please advise?

## 2018-03-25 ENCOUNTER — Encounter: Payer: Self-pay | Admitting: Family Medicine

## 2018-03-25 ENCOUNTER — Ambulatory Visit (INDEPENDENT_AMBULATORY_CARE_PROVIDER_SITE_OTHER): Payer: 59 | Admitting: Family Medicine

## 2018-03-25 ENCOUNTER — Other Ambulatory Visit: Payer: Self-pay

## 2018-03-25 VITALS — BP 160/92 | HR 88 | Temp 98.4°F | Resp 14 | Ht 68.0 in | Wt 162.0 lb

## 2018-03-25 DIAGNOSIS — F17200 Nicotine dependence, unspecified, uncomplicated: Secondary | ICD-10-CM | POA: Diagnosis not present

## 2018-03-25 DIAGNOSIS — E7800 Pure hypercholesterolemia, unspecified: Secondary | ICD-10-CM | POA: Diagnosis not present

## 2018-03-25 DIAGNOSIS — I671 Cerebral aneurysm, nonruptured: Secondary | ICD-10-CM | POA: Diagnosis not present

## 2018-03-25 DIAGNOSIS — G459 Transient cerebral ischemic attack, unspecified: Secondary | ICD-10-CM | POA: Diagnosis not present

## 2018-03-25 MED ORDER — CLOPIDOGREL BISULFATE 75 MG PO TABS: 75.0000 mg | ORAL_TABLET | Freq: Every day | ORAL | 3 refills | Status: DC

## 2018-03-25 MED ORDER — ROSUVASTATIN CALCIUM 40 MG PO TABS: 40.0000 mg | ORAL_TABLET | Freq: Every day | ORAL | 3 refills | Status: DC

## 2018-03-25 MED ORDER — LISINOPRIL 20 MG PO TABS: 20.0000 mg | ORAL_TABLET | Freq: Every day | ORAL | 3 refills | Status: DC

## 2018-03-25 NOTE — Telephone Encounter (Signed)
Absolutely.

## 2018-03-25 NOTE — Telephone Encounter (Signed)
Referral placed in Epic.

## 2018-03-25 NOTE — Progress Notes (Signed)
Subjective:    Patient ID: Jacob Yoder, male    DOB: 08/22/1956, 62 y.o.   MRN: [id number]  HPI Patient has recently been seen in the emergency room on 2 separate occasions for symptoms that he attributes to a TIA.  He states that his entire mouth will become numb.  His tongue will become numb.  His jaw will become numb the inside of his mouth suggesting 7th or 9th cranial nerve involvement.  He will also develop balance abnormalities and feel dizzy as though he is going to fall.  He will also have symptoms where he will be unable to focus in his vision will become blurry.  This all occurs roughly simultaneously.  It can last minutes to hours and then resolve spontaneously.  To me this suggest posterior circulation TIA.  He went to the emergency room.  Head CT was negative.  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  CTA of the head neck confirmed 5 to 6 mm saccular aneurysm.  It showed no stenosis in the vertebrobasilar system.  CTA NECK FINDINGS  Aortic arch: Atherosclerotic calcification. Three vessel branching. No dilatation or dissection.  Right carotid system: Vessels are smooth and diffusely patent. No noted atheromatous changes.  Left carotid system: Vessels are smooth and diffusely patent. Mild atherosclerotic plaque at the ICA bulb and common carotid origin.  Vertebral arteries: Left subclavian origin atherosclerosis. No subclavian stenosis. The left vertebral artery is strongly dominant. When accounting for streak artifactand mild deformity by osteophytes the vertebral arteries are smooth and diffusely patent.  Skeleton: Diffuse cervical disc degeneration with high-grade narrowing from C3-4 to C6-7. No acute or aggressive finding.  Other neck: No acute finding.  Upper chest: No acute finding. Few tiny nodular densities in the right upper lobe are clustered, suggesting inflammatory process.  Review of the MIP images confirms the  above findings  CTA HEAD FINDINGS  Anterior circulation: Overall mild atherosclerotic plaque on the carotid siphons. Hypoplastic right A1 segment. There is a superior and anteriorly directed right MCA bifurcation aneurysm with pointed appearance at the apex. The aneurysm has a broad neck. In maximal span the sac measures 6 mm. See 3D reformats.  2 mm outpouching from the right supraclinoid ICA. The origin of the posterior communicating artery arises from or near the sac.  Posterior circulation: Tiny right vertebral artery with minimal contribution to the basilar. Proximal basilar fenestration. No branch occlusion or beading. Negative for aneurysm.  Venous sinuses: Patent  Anatomic variants: As above  Delayed phase: No abnormal intracranial enhancement.  Review of the MIP images confirms the above findings  IMPRESSION: 1. 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 The sac is pointed. No acute hemorrhage. 2. 2 mm right posterior communicating aneurysm or infundibulum. 3. Overall mild atherosclerosis.  No flow limiting stenosis.   Patient was taking aspirin 81 mg a day.  He was taking simvastatin 20 mg a day.  He is smoking.  His blood pressure is elevated today 160/92.  No changes were made in his medication at the hospital.  They recommended that he see interventional radiology for treatment of his aneurysm.  However I do not believe the middle cerebral artery aneurysm has any effect on the symptoms he is been having.  I believe this is a coincidental finding. Past Medical History:  Diagnosis Date  . DDD (degenerative disc disease), lumbar   . Hyperlipidemia   . Smoker    Past  Surgical History:  Procedure Laterality Date  . COLONOSCOPY W/ POLYPECTOMY    . HAND SURGERY     right; ring finger amputated  . WISDOM TOOTH EXTRACTION     Current Outpatient Medications on File Prior to Visit  Medication Sig Dispense Refill  . aspirin 81 MG tablet Take 81 mg  by mouth daily.    . celecoxib (CELEBREX) 200 MG capsule Take 1 capsule (200 mg total) by mouth daily. 90 capsule 3  . Cyanocobalamin (B-12 PO) Take 1 tablet by mouth daily.    . Multiple Vitamin (MULTIVITAMIN) tablet Take 1 tablet by mouth daily.    . simvastatin (ZOCOR) 40 MG tablet Take 1 tablet (40 mg total) by mouth at bedtime. (Patient taking differently: Take 20 mg by mouth at bedtime. ) 90 tablet 3   Current Facility-Administered Medications on File Prior to Visit  Medication Dose Route Frequency Provider Last Rate Last Dose  . 0.9 %  sodium chloride infusion  500 mL Intravenous Continuous Armbruster, Carlota Raspberry, MD       No Known Allergies Social History   Socioeconomic History  . Marital status: Divorced    Spouse name: Not on file  . Number of children: Not on file  . Years of education: Not on file  . Highest education level: Not on file  Occupational History  . Not on file  Social Needs  . Financial resource strain: Not on file  . Food insecurity:    Worry: Not on file    Inability: Not on file  . Transportation needs:    Medical: Not on file    Non-medical: Not on file  Tobacco Use  . Smoking status: Current Every Day Smoker    Packs/day: 0.50    Years: 35.00    Pack years: 17.50    Types: Cigarettes  . Smokeless tobacco: Never Used  Substance and Sexual Activity  . Alcohol use: Yes    Comment: weekends only  . Drug use: No  . Sexual activity: Not on file  Lifestyle  . Physical activity:    Days per week: Not on file    Minutes per session: Not on file  . Stress: Not on file  Relationships  . Social connections:    Talks on phone: Not on file    Gets together: Not on file    Attends religious service: Not on file    Active member of club or organization: Not on file    Attends meetings of clubs or organizations: Not on file    Relationship status: Not on file  . Intimate partner violence:    Fear of current or ex partner: Not on file    Emotionally  abused: Not on file    Physically abused: Not on file    Forced sexual activity: Not on file  Other Topics Concern  . Not on file  Social History Narrative  . Not on file      Review of Systems  All other systems reviewed and are negative.      Objective:   Physical Exam  Constitutional: He is oriented to person, place, and time.  Cardiovascular: Normal rate, regular rhythm and normal heart sounds. Exam reveals no gallop and no friction rub.  No murmur heard. Pulmonary/Chest: Effort normal and breath sounds normal. No stridor. No respiratory distress. He has no wheezes. He has no rales.  Neurological: He is alert and oriented to person, place, and time. He displays normal reflexes. No cranial  nerve deficit or sensory deficit. He exhibits normal muscle tone. Coordination normal.  Vitals reviewed.         Assessment & Plan:  TIA (transient ischemic attack) - Plan: ECHOCARDIOGRAM COMPLETE, Ambulatory referral to Neurology  Aneurysm of middle cerebral artery - Plan: Ambulatory referral to Interventional Radiology  Pure hypercholesterolemia  Smoker  I have recommended a referral to interventional radiology to discuss treatment of his saccular aneurysm.  In the meantime, I recommended controlling his blood pressure more aggressively and start him on lisinopril 20 mg a day also recommended smoking cessation.  Given his TIA like episodes, I have recommended discontinuation of aspirin as he was taking 81 mg aspirin prior to the attacks and I have recommended switching to Plavix 75 mg a day.  I recommended discontinuation of simvastatin and replacing it with Crestor 40 mg a day.  I have also recommended smoking cessation.  Differential diagnosis includes TIA, panic attack, vertebrobasilar insufficiency, demyelinating diseases such as MS.  Therefore I would like a second opinion with neurology but I will institute treatment changes to prevent TIA.  I have also recommended an  echocardiogram of the heart to evaluate for cardioembolic sources of stroke.  However symptoms sound consistent with microvascular phenomenon and therefore I recommended smoking cessation, Plavix, and Crestor

## 2018-03-26 ENCOUNTER — Telehealth (HOSPITAL_COMMUNITY): Payer: Self-pay | Admitting: Radiology

## 2018-03-26 NOTE — Telephone Encounter (Signed)
Called pt left VM for him to call to schedule his consult for brain aneurysm. Pt referred by PCP. JM

## 2018-03-30 ENCOUNTER — Ambulatory Visit (HOSPITAL_COMMUNITY)
Admission: RE | Admit: 2018-03-30 | Discharge: 2018-03-30 | Disposition: A | Discharge status: Home / Self Care | Payer: Self-pay | Source: Ambulatory Visit | Attending: Interventional Radiology | Admitting: Interventional Radiology

## 2018-03-30 ENCOUNTER — Other Ambulatory Visit (HOSPITAL_COMMUNITY): Payer: Self-pay | Admitting: Interventional Radiology

## 2018-03-30 DIAGNOSIS — I729 Aneurysm of unspecified site: Secondary | ICD-10-CM

## 2018-03-30 HISTORY — PX: IR RADIOLOGIST EVAL & MGMT: IMG5224

## 2018-03-31 ENCOUNTER — Telehealth (HOSPITAL_COMMUNITY): Payer: Self-pay | Admitting: Radiology

## 2018-03-31 ENCOUNTER — Encounter (HOSPITAL_COMMUNITY): Payer: Self-pay | Admitting: Interventional Radiology

## 2018-03-31 NOTE — Telephone Encounter (Signed)
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 JM

## 2018-04-01 ENCOUNTER — Other Ambulatory Visit (HOSPITAL_COMMUNITY): Payer: Self-pay | Admitting: Interventional Radiology

## 2018-04-01 DIAGNOSIS — I671 Cerebral aneurysm, nonruptured: Secondary | ICD-10-CM

## 2018-04-02 ENCOUNTER — Other Ambulatory Visit: Payer: Self-pay

## 2018-04-02 ENCOUNTER — Ambulatory Visit (HOSPITAL_COMMUNITY): Payer: 59 | Attending: Cardiology

## 2018-04-02 DIAGNOSIS — E785 Hyperlipidemia, unspecified: Secondary | ICD-10-CM | POA: Diagnosis not present

## 2018-04-02 DIAGNOSIS — I351 Nonrheumatic aortic (valve) insufficiency: Secondary | ICD-10-CM | POA: Insufficient documentation

## 2018-04-02 DIAGNOSIS — G459 Transient cerebral ischemic attack, unspecified: Secondary | ICD-10-CM | POA: Diagnosis not present

## 2018-04-02 DIAGNOSIS — H538 Other visual disturbances: Secondary | ICD-10-CM | POA: Insufficient documentation

## 2018-04-02 DIAGNOSIS — Z720 Tobacco use: Secondary | ICD-10-CM | POA: Insufficient documentation

## 2018-04-08 ENCOUNTER — Other Ambulatory Visit: Payer: Self-pay

## 2018-04-08 ENCOUNTER — Telehealth: Payer: Self-pay | Admitting: Diagnostic Neuroimaging

## 2018-04-08 ENCOUNTER — Ambulatory Visit (INDEPENDENT_AMBULATORY_CARE_PROVIDER_SITE_OTHER): Payer: 59 | Admitting: *Deleted

## 2018-04-08 ENCOUNTER — Telehealth: Payer: Self-pay | Admitting: *Deleted

## 2018-04-08 VITALS — BP 148/86 | HR 86 | Resp 16 | Ht 68.0 in | Wt 162.0 lb

## 2018-04-08 DIAGNOSIS — I10 Essential (primary) hypertension: Secondary | ICD-10-CM

## 2018-04-08 NOTE — Telephone Encounter (Signed)
Aspirin will have no impact on bp. I would up lisinopril to 40 mg a day.

## 2018-04-08 NOTE — Telephone Encounter (Signed)
Jacob Yoder, Please schedule this patient with me. Thank you.

## 2018-04-08 NOTE — Telephone Encounter (Signed)
Patient seen in office for BP check.   Noted at 148/86 on L arm while sitting with normal size cuff.   Patient currently taking Lisinopril 20mg  PO QD.   MD please advise.

## 2018-04-08 NOTE — Telephone Encounter (Signed)
Please notify patient, this is better, but I would up lisinopril to 40 mg a day to try to get BP less than 140/90.    Also continue to encourage him regarding smoking cessation.  Let him know we have spoke to neurology and they should be in contact with him to arrange follow up in the next week or so.

## 2018-04-08 NOTE — Telephone Encounter (Signed)
Dr. Dennard Schaumann called [place] today requesting sooner outpatient neurology follow-up, due to recurrent TIA symptoms. Patient was eval'd in ER recently. Will set up follow-up appointment in our clinic in the next 1 to 2 weeks.  Penni Bombard, MD 0/05/6225, 3:33 PM Certified in Neurology, Neurophysiology and Neuroimaging  [facility] [address] [place], [place] [address] [phone]

## 2018-04-08 NOTE — Telephone Encounter (Signed)
Appt made per [facility] in referrals with Janett Billow NP.

## 2018-04-08 NOTE — Telephone Encounter (Signed)
Call placed to patient.   States that was contacted by Neuro today, but they did not move his appointment at this time. Reports that he was advised to resume ASA 81mg  PO QD. States that he would like to have x1 week on both Lisinopril and ASA before upping dosage.   MD please advise.

## 2018-04-08 NOTE — Progress Notes (Signed)
Patient seen in office for BP check.   Noted at 148/86 on L arm while sitting with normal size cuff.   Patient endorses that he has accepted MD recommendations in regards to medications.   Patient has stopped ASA 81mg  and replaced with Plavix 75mg . Patient has stopped Zocor 20mg  and replaced with Crestor 40mg . Patient has began Lisinopril 20mg . Reports that he has had some increased flatulence and looser stools than his baseline, but advised patient that this is a common SE from statin mediations.   MD to be made aware of current BP reading and will await further instructions. (please see phone note for more information).

## 2018-04-12 NOTE — Telephone Encounter (Signed)
Call placed to patient and patient made aware.   Will take 2 of the lisinopril 20mg  PO QD. Will come back for BP check this week.

## 2018-04-15 ENCOUNTER — Encounter: Payer: Self-pay | Admitting: Adult Health

## 2018-04-15 ENCOUNTER — Telehealth: Payer: Self-pay | Admitting: Family Medicine

## 2018-04-15 ENCOUNTER — Ambulatory Visit: Payer: PRIVATE HEALTH INSURANCE

## 2018-04-15 ENCOUNTER — Ambulatory Visit (INDEPENDENT_AMBULATORY_CARE_PROVIDER_SITE_OTHER): Payer: 59 | Admitting: Adult Health

## 2018-04-15 ENCOUNTER — Other Ambulatory Visit: Payer: Self-pay | Admitting: Radiology

## 2018-04-15 VITALS — BP 124/72

## 2018-04-15 VITALS — BP 109/68 | HR 78 | Ht 68.0 in | Wt 165.0 lb

## 2018-04-15 DIAGNOSIS — I729 Aneurysm of unspecified site: Secondary | ICD-10-CM | POA: Diagnosis not present

## 2018-04-15 DIAGNOSIS — G459 Transient cerebral ischemic attack, unspecified: Secondary | ICD-10-CM | POA: Diagnosis not present

## 2018-04-15 DIAGNOSIS — E785 Hyperlipidemia, unspecified: Secondary | ICD-10-CM | POA: Diagnosis not present

## 2018-04-15 DIAGNOSIS — I10 Essential (primary) hypertension: Secondary | ICD-10-CM | POA: Diagnosis not present

## 2018-04-15 MED ORDER — ASPIRIN EC 325 MG PO TBEC: 325.0000 mg | DELAYED_RELEASE_TABLET | Freq: Every day | ORAL | 0 refills | Status: AC

## 2018-04-15 NOTE — Progress Notes (Signed)
[facility] [address] [place]. [place] [address] [phone]       OFFICE CONSULT NOTE  Mr. Jacob Yoder Date of Birth:  12-07-1955 Medical Record Number:  [id number]   Reason for Referral:  Frequent TIAs Provider referred: Dr. Jenna Luo  CHIEF COMPLAINT:  Chief Complaint  Patient presents with  . Referral    Referral from Dr. 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  CTA of the head neck confirmed 5 to 6 mm saccular aneurysm.  It showed no stenosis     HPI: Jacob Yoder is being seen today for initial visit in the office for frequent TIA like symptoms. History obtained from patient and chart review. Reviewed all radiology images and labs personally.  Jacob Yoder is a 62 y.o. male with PMH of HLD and tobacco use  who presented to the emergency department on 03/22/2018 for evaluation of strokelike symptoms of a brief episode of lip numbness, tongue numbness, and vision changes 4 days prior which lasted approximately 4 minutes.  He states that his vision became very blurry and he felt numb in his face.  He also had balance issues feeling like he was going to fall over if he did not brace himself.  He denied any upper or lower extremity weakness/numbness.  He did not notice any speech changes or difficulty swallowing.  He has no history or family history of TIA or stroke.   He states that he has had 1-2 of the symptoms in the past that would last for a very short time but due to them not being as intense, he stated that he would ignore them.   CT head reviewed which showed no intracranial abnormality.  MRI brain was negative for acute stroke hemorrhage or mass lesion but did show bulbous enlargement of the right MCA bifurcation/trifurcation which could represent saccular aneurysm.  CTA head and neck were performed which showed 6 mm right MCA bifurcation aneurysm and 2 mm right  posterior communicating aneurysm.  2D echo completed which showed EF of 50 to 55%.  Patient was previously on aspirin 81 mg along with simvastatin 20 mg daily and this has not changed during hospitalization.  Recommended to follow-up with interventional radiology for treatment of aneurysm but it was not believed that these aneurysms were the cause for his symptoms.  Patient had follow-up appointment with PCP shortly after this hospitalization and as he was on aspirin 81 mg PTA it was recommended to stop aspirin and start Plavix.  He was also recommended to stop pravastatin and start Crestor.  Patient did have a follow-up appointment with Dr. Estanislado Pandy and is planning on undergoing diagnostic catheter arteriogram on 04/21/2018 then will be discussed after this procedure intervention at that time.  It was also recommended to continue Plavix but also add aspirin 325mg  7 days prior to this procedure.  Patient states that he was taking aspirin to 50 as he found 500 mg tablets of aspirin at the [facility] and was breaking these in half.  It was highly advised to take the full amount of aspirin 325 as recommended prior to procedure.  Patient denies any symptoms at this time or denies recurrent symptoms since hospitalization.  He states he has returned back to all previous activities without complications.  As he continues both Plavix and aspirin he denies bleeding or bruising.  Continues on Crestor without side effects myalgias.  Blood pressure today 109/68.  Patient continues to smoke but states he is decreasing this amount and is using nicotine patch.  Recommended to patient to check lipid panel and A1c as this was not performed during hospitalization and depending on LDL level, there may be a change in statin management.  This was a one-time event with possible meniscal episodes in the past, this is most likely a TIA and patient was advised the risk factors need to be continued to be managed and monitored.  Patient  is aware that these symptoms are most likely not related to aneurysms that were most likely incidental finding.  Patient denies any other symptoms or concerns/questions at this time.   ROS:   14 system review of systems performed and negative with exception of ringing in ears, memory loss and back pain  PMH:  Past Medical History:  Diagnosis Date  . DDD (degenerative disc disease), lumbar   . Hyperlipidemia   . Hypertension   . Smoker     PSH:  Past Surgical History:  Procedure Laterality Date  . COLONOSCOPY W/ POLYPECTOMY    . HAND SURGERY     right; ring finger amputated  . IR RADIOLOGIST EVAL & MGMT  03/30/2018  . WISDOM TOOTH EXTRACTION      Social History:  Social History   Socioeconomic History  . Marital status: Divorced    Spouse name: Not on file  . Number of children: Not on file  . Years of education: Not on file  . Highest education level: Not on file  Occupational History  . Not on file  Social Needs  . Financial resource strain: Not on file  . Food insecurity:    Worry: Not on file    Inability: Not on file  . Transportation needs:    Medical: Not on file    Non-medical: Not on file  Tobacco Use  . Smoking status: Current Every Day Smoker    Packs/day: 0.50    Years: 35.00    Pack years: 17.50    Types: Cigarettes  . Smokeless tobacco: Never Used  Substance and Sexual Activity  . Alcohol use: Yes    Comment: weekends only  . Drug use: No  . Sexual activity: Not on file  Lifestyle  . Physical activity:    Days per week: Not on file    Minutes per session: Not on file  . Stress: Not on file  Relationships  . Social connections:    Talks on phone: Not on file    Gets together: Not on file    Attends religious service: Not on file    Active member of club or organization: Not on file    Attends meetings of clubs or organizations: Not on file    Relationship status: Not on file  . Intimate partner violence:    Fear of current or ex  partner: Not on file    Emotionally abused: Not on file    Physically abused: Not on file    Forced sexual activity: Not on file  Other Topics Concern  . Not on file  Social History Narrative  . Not on file    Family History:  Family History  Problem Relation Age of Onset  . Heart disease Paternal Grandfather   . Hyperlipidemia Mother   . Heart disease Father   . Cancer Father   . Hypertension Sister   . Hyperlipidemia Sister   . Colon cancer Neg Hx  Medications:   Current Outpatient Medications on File Prior to Visit  Medication Sig Dispense Refill  . Acetaminophen (TYLENOL) 325 MG CAPS Take by mouth as needed (650 for pain).    Marland Kitchen clopidogrel (PLAVIX) 75 MG tablet Take 1 tablet (75 mg total) by mouth daily. 90 tablet 3  . Cyanocobalamin (B-12 PO) Take 1 tablet by mouth daily.    Marland Kitchen lisinopril (PRINIVIL,ZESTRIL) 20 MG tablet Take 1 tablet (20 mg total) by mouth daily. (Patient taking differently: Take 20 mg by mouth daily. ) 90 tablet 3  . Multiple Vitamin (MULTIVITAMIN) tablet Take 1 tablet by mouth daily.    . rosuvastatin (CRESTOR) 40 MG tablet Take 1 tablet (40 mg total) by mouth daily. 90 tablet 3   No current facility-administered medications on file prior to visit.     Allergies:  No Known Allergies   Physical Exam  Vitals:   04/15/18 1009  BP: 109/68  Pulse: 78  Weight: 165 lb (74.8 kg)  Height: 5\' 8"  (1.727 m)   Body mass index is 25.09 kg/m. No exam data present  General: well developed, pleasant middle-aged Caucasian male, well nourished, seated, in no evident distress Head: head normocephalic and atraumatic.   Neck: supple with no carotid or supraclavicular bruits Cardiovascular: regular rate and rhythm, no murmurs Musculoskeletal: no deformity Skin:  no rash/petichiae Vascular:  Normal pulses all extremities  Neurologic Exam Mental Status: Awake and fully alert. Oriented to place and time. Recent and remote memory intact. Attention span,  concentration and fund of knowledge appropriate. Mood and affect appropriate.  Cranial Nerves: Fundoscopic exam reveals sharp disc margins. Pupils equal, briskly reactive to light. Extraocular movements full without nystagmus. Visual fields full to confrontation. Hearing intact. Facial sensation intact. Face, tongue, palate moves normally and symmetrically.  Motor: Normal bulk and tone. Normal strength in all tested extremity muscles. Sensory.: intact to touch , pinprick , position and vibratory sensation.  Coordination: Rapid alternating movements normal in all extremities. Finger-to-nose and heel-to-shin performed accurately bilaterally. Gait and Station: Arises from chair without difficulty. Stance is normal. Gait demonstrates normal stride length and balance . Able to heel, toe and tandem walk without difficulty.  Reflexes: 1+ and symmetric. Toes downgoing.    NIHSS  0 Modified Rankin  0   Diagnostic Data (Labs, Imaging, Testing)  CT HEAD WO CONTRAST 03/22/18 IMPRESSION: No acute intracranial abnormality is noted.  MR BRAIN WO CONTRAST 03/22/18 IMPRESSION: Borderline atrophy.  Mild chronic microvascular ischemic change. No acute stroke, visible hemorrhage, or mass lesion. Bulbous enlargement at the RIGHT MCA bifurcation/trifurcation, could represent a saccular aneurysm. CTA head neck recommended for further Evaluation.  CT ANGIO HEAD W OR WO CONTRAST CT ANGIO NECK W OR WO CONTRAST 03/23/18 IMPRESSION: 1. 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 The sac is pointed. No acute hemorrhage. 2. 2 mm right posterior communicating aneurysm or infundibulum. 3. Overall mild atherosclerosis.  No flow limiting stenosis.  ECHOCARDIOGRAM 04/02/18 Study Conclusions - Left ventricle: The cavity size was normal. Systolic function was   at the lower limits of normal. The estimated ejection fraction   was in the range of 50% to 55%. Wall motion was normal; there   were no regional  wall motion abnormalities. Doppler parameters   are consistent with abnormal left ventricular relaxation (grade 1   diastolic dysfunction). - Aortic valve: There was mild to moderate regurgitation directed   centrally in the LVOT.     ASSESSMENT: Jacob Yoder is a 62  y.o. year old male here with TIA and incidental aneurysm findings on 03/22/18. Vascular risk factors include HLD and HTN.     PLAN: -Continue aspirin 325 mg daily and clopidogrel 75 mg daily  and crestor  for secondary stroke prevention -lipid panel and A1c levels - consider changing statin therapy depending on levels -f/u with Deveshwar for aneurysm intervention -advised patient that he will continue on recommendations from Dr. Estanislado Pandy as far as blood thinners after procedure -F/u with PCP regarding your HLD and HTN management -continue to monitor BP at home -Maintain strict control of hypertension with blood pressure goal below 130/90, diabetes with hemoglobin A1c goal below 6.5% and cholesterol with LDL cholesterol (bad cholesterol) goal below 70 mg/dL. I also advised the patient to eat a healthy diet with plenty of whole grains, cereals, fruits and vegetables, exercise regularly and maintain ideal body weight.  Follow up in 3 months or call earlier if needed   Greater than 50% of time during this 25 minute visit was spent on counseling,explanation of diagnosis of TIA, reviewing risk factor management of HLD and HTN, planning of further management, discussion with patient and family and coordination of care   Venancio Poisson, [facility]  [facility] [address] [place], [place] [address]  Phone [phone] Fax [phone]

## 2018-04-15 NOTE — Telephone Encounter (Signed)
Those BP are perfect.  No further changes in meds.

## 2018-04-15 NOTE — Telephone Encounter (Signed)
Patient came in today for a follow up blood pressure check.Verified with patient that he is currently taking Lisinopril 40mg . Initial blood pressure was at 130/76 in the right arm while sitting with a normal size cuff. Patient informed me that he was feeling nervous allowed patient to sit in quiet room for 10 minutes and I rechecked blood pressure and reading was 124/72.

## 2018-04-15 NOTE — Progress Notes (Signed)
Patient came in today for a follow up blood pressure check.Verified with patient that he is currently taking Lisinopril 40mg . Initial blood pressure was at 130/76 in the right arm while sitting with a normal size cuff. Patient informed me that he was feeling nervous allowed patient to sit in quiet room for 10 minutes and I rechecked blood pressure and reading was 124/72. Advised patient that I would send telephone call to MD to notify him of findings.

## 2018-04-15 NOTE — Patient Instructions (Addendum)
Continue aspirin 325 mg daily and clopidogrel 75 mg daily  and crestor  for secondary stroke prevention  Check cholesterol and A1c (diabetes) level today   Follow up with Dr. Estanislado Pandy for aneurysm intervention   Continue smoking cessation and continue to decrease amount you are smoking per day  Continue to follow up with PCP regarding cholesterol and blood pressure management   Continue to monitor blood pressure at home  Maintain strict control of hypertension with blood pressure goal below 130/90, diabetes with hemoglobin A1c goal below 6.5% and cholesterol with LDL cholesterol (bad cholesterol) goal below 70 mg/dL. I also advised the patient to eat a healthy diet with plenty of whole grains, cereals, fruits and vegetables, exercise regularly and maintain ideal body weight.  Followup in the future with me in 3 months or call earlier if needed       Thank you for coming to see [place] at [facility]. I hope we have been able to provide you high quality care today.  You may receive a patient satisfaction survey over the next few weeks. We would appreciate your feedback and comments so that we may continue to improve ourselves and the health of our patients.

## 2018-04-16 LAB — HEMOGLOBIN A1C
Est. average glucose Bld gHb Est-mCnc: 126 mg/dL
Hgb A1c MFr Bld: 6 % — ABNORMAL HIGH (ref 4.8–5.6)

## 2018-04-16 LAB — LIPID PANEL
CHOLESTEROL TOTAL: 138 mg/dL (ref 100–199)
Chol/HDL Ratio: 2.4 ratio (ref 0.0–5.0)
HDL: 57 mg/dL (ref >39)
LDL Calculated: 59 mg/dL (ref 0–99)
Triglycerides: 112 mg/dL (ref 0–149)
VLDL Cholesterol Cal: 22 mg/dL (ref 5–40)

## 2018-04-16 NOTE — Pre-Procedure Instructions (Signed)
Jacob Yoder  04/16/2018      [facility] - [place], [place] - [address] [place] [address] Phone: [phone] Fax: [phone]  [id number] [place], [place] - [address] [facility] [address] [place] [address] Phone: [phone] Fax: [phone]    Your procedure is scheduled on 04/21/18.  Report to [facility] Admitting at 7 A.M.  Call this number if you have problems the morning of surgery:  [phone]   Remember:  Do not eat or drink after midnight.      Take these medicines the morning of surgery with A SIP OF WATER --aspirin,plavix    Do not wear jewelry, make-up or nail polish.  Do not wear lotions, powders, or perfumes, or deodorant.  Do not shave 48 hours prior to surgery.  Men may shave face and neck.  Do not bring valuables to the hospital.  [facility] is not responsible for any belongings or valuables.  Contacts, dentures or bridgework may not be worn into surgery.  Leave your suitcase in the car.  After surgery it may be brought to your room.  For patients admitted to the hospital, discharge time will be determined by your treatment team.  Patients discharged the day of surgery will not be allowed to drive home.   Name and phone number of your driver:   Special instructions:  [place] - Preparing for Surgery  Before surgery, you can play an important role.  Because skin is not sterile, your skin needs to be as free of germs as possible.  You can reduce the number of germs on you skin by washing with CHG (chlorahexidine gluconate) soap before surgery.  CHG is an antiseptic cleaner which kills germs and bonds with the skin to continue killing germs even after washing.  Oral Hygiene is also important in reducing the risk of infection.  Remember to brush your teeth with your regular toothpaste the morning of surgery.  Please DO NOT use if you  have an allergy to CHG or antibacterial soaps.  If your skin becomes reddened/irritated stop using the CHG and inform your nurse when you arrive at Short Stay.  Do not shave (including legs and underarms) for at least 48 hours prior to the first CHG shower.  You may shave your face.  Please follow these instructions carefully:   1.  Shower with CHG Soap the night before surgery and the morning of Surgery.  2.  If you choose to wash your hair, wash your hair first as usual with your normal shampoo.  3.  After you shampoo, rinse your hair and body thoroughly to remove the shampoo. 4.  Use CHG as you would any other liquid soap.  You can apply chg directly to the skin and wash gently with a      scrungie or washcloth.           5.  Apply the CHG Soap to your body ONLY FROM THE NECK DOWN.   Do not use on open wounds or open sores. Avoid contact with your eyes, ears, mouth and genitals (private parts).  Wash genitals (private parts) with your normal soap.  6.  Wash thoroughly, paying special attention to the area where your surgery will be performed.  7.  Thoroughly rinse your body with warm water from the neck down.  8.  DO NOT shower/wash with  your normal soap after using and rinsing off the CHG Soap.  9.  Pat yourself dry with a clean towel.            10.  Wear clean pajamas.            11.  Place clean sheets on your bed the night of your first shower and do not sleep with pets.  Day of Surgery  Do not apply any lotions/deoderants the morning of surgery.   Please wear clean clothes to the hospital/surgery center. Remember to brush your teeth with toothpaste.    Please read over the following fact sheets that you were given.

## 2018-04-16 NOTE — Progress Notes (Signed)
I agree with the above plan 

## 2018-04-16 NOTE — Telephone Encounter (Signed)
Patient aware of providers recommendations.  

## 2018-04-16 NOTE — Pre-Procedure Instructions (Signed)
JANN RA  04/16/2018      [facility] - [place], [place] - [address] [place] [address] Phone: [phone] Fax: [phone]  [id number] - [place], [place] - [address] [facility] [address] [place] [address] Phone: [phone] Fax: [phone]    Your procedure is scheduled on Wednesday, 04/21/18.   Report to [facility] Admitting at 7 A.M.             (posted procedural time 9a - 1p)   Call this number if you have problems the morning of surgery:  [phone]   Remember:   Do not eat any food or drink any liquids after midnight, Tuesday.     Take these medicines the morning of surgery with A SIP OF WATER --aspirin,plavix    Do not wear jewelry, make-up or nail polish.  Do not wear lotions, powders, perfumes, or deodorant.  Do not shave 48 hours prior to surgery.   Do not bring valuables to the hospital.  [facility] is not responsible for any belongings or valuables.  Contacts, dentures or bridgework may not be worn into surgery.  Leave your suitcase in the car.  After surgery it may be brought to your room.  For patients admitted to the hospital, discharge time will be determined by your treatment team.       Special instructions:  [place] - Preparing for Surgery  Before surgery, you can play an important role.  Because skin is not sterile, your skin needs to be as free of germs as possible.  You can reduce the number of germs on you skin by washing with CHG (chlorahexidine gluconate) soap before surgery.  CHG is an antiseptic cleaner which kills germs and bonds with the skin to continue killing germs even after washing.    Oral Hygiene is also important in reducing the risk of infection.  Remember to brush your teeth with your regular toothpaste the morning of surgery.  Please DO NOT use if you have an allergy to CHG or antibacterial soaps.   If your skin becomes reddened/irritated stop using the CHG and inform your nurse when you arrive at Short Stay.  Do not shave (including legs and underarms) for at least 48 hours prior to the first CHG shower.  You may shave your face.  Please follow these instructions carefully:   1.  Shower with CHG Soap the night before surgery and the morning of Surgery.   2.  If you choose to wash your hair, wash your hair first as usual with your normal shampoo.   3.  After you shampoo, rinse your hair and body thoroughly to remove the shampoo.  4.  Use CHG as you would any other liquid soap.  You can apply chg directly to the skin and wash gently with a      scrungie or washcloth.            5.  Apply the CHG Soap to your body ONLY FROM THE NECK DOWN.   Do not use on open wounds or open sores. Avoid contact with your eyes, ears, mouth and genitals (private parts).  Wash genitals (private parts) with your normal soap.   6.  Wash thoroughly, paying special attention to the area where your surgery will be performed.   7.  Thoroughly rinse your body with warm water from the neck  down.   8.  DO NOT shower/wash with your normal soap after using and rinsing off the CHG Soap.   9.  Pat yourself dry with a clean towel.             10.  Wear clean pajamas.             11.  Place clean sheets on your bed the night of your first shower and do not sleep with pets.  Day of Surgery  Do not apply any lotions/deoderants the morning of surgery.   Please wear clean clothes to the hospital/surgery center.  Remember to brush your teeth with toothpaste.    Please read over the following fact sheets that you were given.

## 2018-04-19 ENCOUNTER — Other Ambulatory Visit: Payer: Self-pay

## 2018-04-19 ENCOUNTER — Encounter (HOSPITAL_COMMUNITY)
Admission: RE | Admit: 2018-04-19 | Discharge: 2018-04-19 | Disposition: A | Discharge status: Home / Self Care | Payer: 59 | Source: Ambulatory Visit | Attending: Interventional Radiology | Admitting: Interventional Radiology

## 2018-04-19 ENCOUNTER — Encounter (HOSPITAL_COMMUNITY): Payer: Self-pay

## 2018-04-19 DIAGNOSIS — Z01812 Encounter for preprocedural laboratory examination: Secondary | ICD-10-CM | POA: Insufficient documentation

## 2018-04-19 HISTORY — DX: Tinnitus, unspecified ear: H93.19

## 2018-04-19 HISTORY — DX: Cerebral infarction, unspecified: I63.9

## 2018-04-19 HISTORY — DX: Unspecified hearing loss, unspecified ear: H91.90

## 2018-04-19 LAB — BASIC METABOLIC PANEL
ANION GAP: 7 (ref 5–15)
BUN: 18 mg/dL (ref 6–20)
CHLORIDE: 108 mmol/L (ref 101–111)
CO2: 25 mmol/L (ref 22–32)
Calcium: 10 mg/dL (ref 8.9–10.3)
Creatinine, Ser: 0.84 mg/dL (ref 0.61–1.24)
GFR calc Af Amer: >60 mL/min (ref >60)
Glucose, Bld: 104 mg/dL — ABNORMAL HIGH (ref 65–99)
POTASSIUM: 4.8 mmol/L (ref 3.5–5.1)
Sodium: 140 mmol/L (ref 135–145)

## 2018-04-19 LAB — CBC WITH DIFFERENTIAL/PLATELET
ABS IMMATURE GRANULOCYTES: 0.1 10*3/uL (ref 0.0–0.1)
BASOS ABS: 0.1 10*3/uL (ref 0.0–0.1)
Basophils Relative: 1 %
EOS PCT: 3 %
Eosinophils Absolute: 0.3 10*3/uL (ref 0.0–0.7)
HEMATOCRIT: 47.4 % (ref 39.0–52.0)
HEMOGLOBIN: 15.7 g/dL (ref 13.0–17.0)
Immature Granulocytes: 1 %
LYMPHS ABS: 1.8 10*3/uL (ref 0.7–4.0)
LYMPHS PCT: 18 %
MCH: 34.5 pg — ABNORMAL HIGH (ref 26.0–34.0)
MCHC: 33.1 g/dL (ref 30.0–36.0)
MCV: 104.2 fL — ABNORMAL HIGH (ref 78.0–100.0)
MONO ABS: 0.7 10*3/uL (ref 0.1–1.0)
Monocytes Relative: 6 %
NEUTROS ABS: 7.3 10*3/uL (ref 1.7–7.7)
Neutrophils Relative %: 71 %
Platelets: 250 10*3/uL (ref 150–400)
RBC: 4.55 MIL/uL (ref 4.22–5.81)
RDW: 12.3 % (ref 11.5–15.5)
WBC: 10.3 10*3/uL (ref 4.0–10.5)

## 2018-04-19 LAB — PROTIME-INR
INR: 1.08
PROTHROMBIN TIME: 13.9 s (ref 11.4–15.2)

## 2018-04-19 LAB — PLATELET INHIBITION P2Y12: PLATELET FUNCTION P2Y12: 60 [PRU] — AB (ref 194–418)

## 2018-04-19 NOTE — Progress Notes (Signed)
   04/19/18 0907  OBSTRUCTIVE SLEEP APNEA  Have you ever been diagnosed with sleep apnea through a sleep study? No  Do you snore loudly (loud enough to be heard through closed doors)?  1  Do you often feel tired, fatigued, or sleepy during the daytime (such as falling asleep during driving or talking to someone)? 0  Has anyone observed you stop breathing during your sleep? 0  Do you have, or are you being treated for high blood pressure? 1  BMI more than 35 kg/m2? 0  Age > 50 (1-yes) 1  Neck circumference greater than:Male 16 inches or larger, Male 17inches or larger? 0  Male Gender (Yes=1) 1  Obstructive Sleep Apnea Score 4  Score 5 or greater  Results sent to PCP

## 2018-04-19 NOTE — Progress Notes (Addendum)
PCP is Dr. Dennard Schaumann. LOV 03/2018 Initial sx were numbness in jaw and tongue.  Was sent for CT of head - aneurysm was incidental finding. Did have echo 03/2018  EF 50 - 55% Back in 2016 had stress test - "normal" Patient having some frontal above the nose pressure.  Believes its sinuses.  Drainage mainly clear at times, no fever, sweats.  I spoke with Jannifer Franklin, PA today.  It will be evaluated on day of procedure.

## 2018-04-20 ENCOUNTER — Other Ambulatory Visit: Payer: Self-pay | Admitting: Student

## 2018-04-21 ENCOUNTER — Ambulatory Visit (HOSPITAL_COMMUNITY)
Admission: RE | Admit: 2018-04-21 | Discharge: 2018-04-21 | Disposition: A | Discharge status: Home / Self Care | Payer: 59 | Source: Ambulatory Visit | Attending: Interventional Radiology | Admitting: Interventional Radiology

## 2018-04-21 ENCOUNTER — Other Ambulatory Visit: Payer: Self-pay

## 2018-04-21 ENCOUNTER — Ambulatory Visit (HOSPITAL_COMMUNITY): Payer: 59 | Admitting: Anesthesiology

## 2018-04-21 ENCOUNTER — Inpatient Hospital Stay (HOSPITAL_COMMUNITY)
Admission: AD | Admit: 2018-04-21 | Discharge: 2018-04-22 | DRG: 027 | Disposition: A | Discharge status: Home / Self Care | Payer: 59 | Attending: Interventional Radiology | Admitting: Interventional Radiology

## 2018-04-21 ENCOUNTER — Encounter (HOSPITAL_COMMUNITY)
Admission: AD | Disposition: A | Discharge status: Home / Self Care | Payer: Self-pay | Source: Home / Self Care | Attending: Interventional Radiology

## 2018-04-21 ENCOUNTER — Encounter (HOSPITAL_COMMUNITY): Payer: Self-pay | Admitting: *Deleted

## 2018-04-21 DIAGNOSIS — Z7902 Long term (current) use of antithrombotics/antiplatelets: Secondary | ICD-10-CM | POA: Diagnosis not present

## 2018-04-21 DIAGNOSIS — I671 Cerebral aneurysm, nonruptured: Principal | ICD-10-CM | POA: Diagnosis present

## 2018-04-21 DIAGNOSIS — H9190 Unspecified hearing loss, unspecified ear: Secondary | ICD-10-CM | POA: Diagnosis present

## 2018-04-21 DIAGNOSIS — Z7982 Long term (current) use of aspirin: Secondary | ICD-10-CM

## 2018-04-21 DIAGNOSIS — I10 Essential (primary) hypertension: Secondary | ICD-10-CM | POA: Diagnosis present

## 2018-04-21 DIAGNOSIS — M5136 Other intervertebral disc degeneration, lumbar region: Secondary | ICD-10-CM | POA: Diagnosis present

## 2018-04-21 DIAGNOSIS — Z8249 Family history of ischemic heart disease and other diseases of the circulatory system: Secondary | ICD-10-CM | POA: Diagnosis not present

## 2018-04-21 DIAGNOSIS — E785 Hyperlipidemia, unspecified: Secondary | ICD-10-CM | POA: Diagnosis present

## 2018-04-21 DIAGNOSIS — Z79899 Other long term (current) drug therapy: Secondary | ICD-10-CM

## 2018-04-21 DIAGNOSIS — F17210 Nicotine dependence, cigarettes, uncomplicated: Secondary | ICD-10-CM | POA: Diagnosis present

## 2018-04-21 DIAGNOSIS — Z8673 Personal history of transient ischemic attack (TIA), and cerebral infarction without residual deficits: Secondary | ICD-10-CM | POA: Diagnosis not present

## 2018-04-21 HISTORY — PX: IR 3D INDEPENDENT WKST: IMG2385

## 2018-04-21 HISTORY — PX: IR ANGIOGRAM FOLLOW UP STUDY: IMG697

## 2018-04-21 HISTORY — PX: IR ANGIO VERTEBRAL SEL VERTEBRAL UNI L MOD SED: IMG5367

## 2018-04-21 HISTORY — PX: IR ANGIO INTRA EXTRACRAN SEL COM CAROTID INNOMINATE UNI L MOD SED: IMG5358

## 2018-04-21 HISTORY — PX: RADIOLOGY WITH ANESTHESIA: SHX6223

## 2018-04-21 HISTORY — PX: IR NEURO EACH ADD'L AFTER BASIC UNI RIGHT (MS): IMG5374

## 2018-04-21 HISTORY — PX: IR ANGIO INTRA EXTRACRAN SEL INTERNAL CAROTID UNI R MOD SED: IMG5362

## 2018-04-21 HISTORY — PX: IR TRANSCATH/EMBOLIZ: IMG695

## 2018-04-21 LAB — CBC
HEMATOCRIT: 35.1 % — AB (ref 39.0–52.0)
Hemoglobin: 11.9 g/dL — ABNORMAL LOW (ref 13.0–17.0)
MCH: 35.1 pg — ABNORMAL HIGH (ref 26.0–34.0)
MCHC: 33.9 g/dL (ref 30.0–36.0)
MCV: 103.5 fL — AB (ref 78.0–100.0)
PLATELETS: 199 10*3/uL (ref 150–400)
RBC: 3.39 MIL/uL — AB (ref 4.22–5.81)
RDW: 12.3 % (ref 11.5–15.5)
WBC: 7.6 10*3/uL (ref 4.0–10.5)

## 2018-04-21 LAB — POCT ACTIVATED CLOTTING TIME
ACTIVATED CLOTTING TIME: 208 s
Activated Clotting Time: 197 seconds

## 2018-04-21 LAB — PLATELET INHIBITION P2Y12: Platelet Function  P2Y12: 76 [PRU] — ABNORMAL LOW (ref 194–418)

## 2018-04-21 LAB — MRSA PCR SCREENING: MRSA by PCR: NEGATIVE

## 2018-04-21 SURGERY — IR WITH ANESTHESIA
Anesthesia: General

## 2018-04-21 MED ORDER — ESMOLOL HCL 100 MG/10ML IV SOLN
INTRAVENOUS | Status: DC | PRN
  Administered 2018-04-21: 20 mg via INTRAVENOUS
  Administered 2018-04-21: 10 mg via INTRAVENOUS

## 2018-04-21 MED ORDER — PROPOFOL 10 MG/ML IV BOLUS
INTRAVENOUS | Status: DC | PRN
  Administered 2018-04-21: 130 mg via INTRAVENOUS

## 2018-04-21 MED ORDER — IOHEXOL 300 MG/ML  SOLN
450.0000 mL | Freq: Once | INTRAMUSCULAR | Status: AC | PRN
  Administered 2018-04-21: 180 mL via INTRA_ARTERIAL

## 2018-04-21 MED ORDER — OXYCODONE HCL 5 MG PO TABS: 5.0000 mg | ORAL_TABLET | Freq: Once | ORAL | Status: DC | PRN

## 2018-04-21 MED ORDER — CEFAZOLIN SODIUM-DEXTROSE 2-4 GM/100ML-% IV SOLN
INTRAVENOUS | Status: AC
  Filled 2018-04-21: qty 100

## 2018-04-21 MED ORDER — SODIUM CHLORIDE 0.9 % IV BOLUS
500.0000 mL | Freq: Once | INTRAVENOUS | Status: AC
  Administered 2018-04-21: 500 mL via INTRAVENOUS

## 2018-04-21 MED ORDER — LIDOCAINE HCL 1 % IJ SOLN
INTRAMUSCULAR | Status: AC
  Administered 2018-04-21: 10 mL
  Filled 2018-04-21: qty 20

## 2018-04-21 MED ORDER — PROMETHAZINE HCL 25 MG/ML IJ SOLN: 6.2500 mg | INTRAMUSCULAR | Status: DC | PRN

## 2018-04-21 MED ORDER — CEFAZOLIN SODIUM-DEXTROSE 2-4 GM/100ML-% IV SOLN
2.0000 g | INTRAVENOUS | Status: AC
  Administered 2018-04-21: 2 g via INTRAVENOUS
  Filled 2018-04-21: qty 100

## 2018-04-21 MED ORDER — ROCURONIUM BROMIDE 100 MG/10ML IV SOLN
INTRAVENOUS | Status: DC | PRN
  Administered 2018-04-21: 50 mg via INTRAVENOUS

## 2018-04-21 MED ORDER — NICARDIPINE HCL IN NACL 20-0.86 MG/200ML-% IV SOLN
0.0000 mg/h | INTRAVENOUS | Status: DC
  Filled 2018-04-21: qty 200

## 2018-04-21 MED ORDER — ACETAMINOPHEN 650 MG RE SUPP: 650.0000 mg | RECTAL | Status: DC | PRN

## 2018-04-21 MED ORDER — ASPIRIN EC 325 MG PO TBEC
DELAYED_RELEASE_TABLET | ORAL | Status: AC
  Administered 2018-04-21: 325 mg via ORAL
  Filled 2018-04-21: qty 1

## 2018-04-21 MED ORDER — HEPARIN (PORCINE) IN NACL 100-0.45 UNIT/ML-% IJ SOLN
INTRAMUSCULAR | Status: AC
  Filled 2018-04-21: qty 250

## 2018-04-21 MED ORDER — SUGAMMADEX SODIUM 200 MG/2ML IV SOLN
INTRAVENOUS | Status: DC | PRN
  Administered 2018-04-21: 150 mg via INTRAVENOUS

## 2018-04-21 MED ORDER — DEXTROSE 5 % IV SOLN
INTRAVENOUS | Status: DC | PRN
  Administered 2018-04-21: 20 ug/min via INTRAVENOUS

## 2018-04-21 MED ORDER — ASPIRIN EC 325 MG PO TBEC
325.0000 mg | DELAYED_RELEASE_TABLET | ORAL | Status: AC
  Administered 2018-04-21: 325 mg via ORAL
  Filled 2018-04-21: qty 1

## 2018-04-21 MED ORDER — MIDAZOLAM HCL 5 MG/5ML IJ SOLN
INTRAMUSCULAR | Status: DC | PRN
  Administered 2018-04-21 (×2): 2 mg via INTRAVENOUS

## 2018-04-21 MED ORDER — LIDOCAINE HCL (CARDIAC) PF 100 MG/5ML IV SOSY
PREFILLED_SYRINGE | INTRAVENOUS | Status: DC | PRN
  Administered 2018-04-21: 80 mg via INTRAVENOUS

## 2018-04-21 MED ORDER — EPHEDRINE SULFATE 50 MG/ML IJ SOLN
INTRAMUSCULAR | Status: DC | PRN
  Administered 2018-04-21 (×2): 5 mg via INTRAVENOUS
  Administered 2018-04-21: 2.5 mg via INTRAVENOUS
  Administered 2018-04-21: 7.5 mg via INTRAVENOUS
  Administered 2018-04-21 (×2): 5 mg via INTRAVENOUS

## 2018-04-21 MED ORDER — ASPIRIN 81 MG PO CHEW: 324.0000 mg | CHEWABLE_TABLET | Freq: Every day | ORAL | Status: DC

## 2018-04-21 MED ORDER — ONDANSETRON HCL 4 MG/2ML IJ SOLN
INTRAMUSCULAR | Status: DC | PRN
  Administered 2018-04-21: 4 mg via INTRAVENOUS

## 2018-04-21 MED ORDER — NITROGLYCERIN 1 MG/10 ML FOR IR/CATH LAB
INTRA_ARTERIAL | Status: AC
  Filled 2018-04-21: qty 10

## 2018-04-21 MED ORDER — ASPIRIN 325 MG PO TABS
325.0000 mg | ORAL_TABLET | Freq: Every day | ORAL | Status: DC
  Administered 2018-04-22: 325 mg via ORAL
  Filled 2018-04-21: qty 1

## 2018-04-21 MED ORDER — HEPARIN SODIUM (PORCINE) 1000 UNIT/ML IJ SOLN
INTRAMUSCULAR | Status: DC | PRN
  Administered 2018-04-21: 1000 [IU] via INTRAVENOUS
  Administered 2018-04-21: 2000 [IU] via INTRAVENOUS

## 2018-04-21 MED ORDER — CLOPIDOGREL BISULFATE 75 MG PO TABS: 75.0000 mg | ORAL_TABLET | Freq: Every day | ORAL | Status: DC

## 2018-04-21 MED ORDER — SODIUM CHLORIDE 0.9 % IV SOLN: INTRAVENOUS | Status: DC

## 2018-04-21 MED ORDER — SODIUM CHLORIDE 0.9 % IV SOLN
INTRAVENOUS | Status: DC
  Administered 2018-04-21: 08:00:00 via INTRAVENOUS

## 2018-04-21 MED ORDER — PHENYLEPHRINE HCL 10 MG/ML IJ SOLN
INTRAMUSCULAR | Status: DC | PRN
  Administered 2018-04-21: 80 ug via INTRAVENOUS
  Administered 2018-04-21: 120 ug via INTRAVENOUS
  Administered 2018-04-21 (×2): 40 ug via INTRAVENOUS
  Administered 2018-04-21: 120 ug via INTRAVENOUS

## 2018-04-21 MED ORDER — CLOPIDOGREL BISULFATE 75 MG PO TABS
75.0000 mg | ORAL_TABLET | Freq: Once | ORAL | Status: AC
  Administered 2018-04-21: 75 mg via ORAL
  Filled 2018-04-21: qty 1

## 2018-04-21 MED ORDER — HEPARIN (PORCINE) IN NACL 100-0.45 UNIT/ML-% IJ SOLN
500.0000 [IU]/h | INTRAMUSCULAR | Status: DC
  Administered 2018-04-21: 500 [IU]/h via INTRAVENOUS

## 2018-04-21 MED ORDER — LACTATED RINGERS IV SOLN
INTRAVENOUS | Status: DC | PRN
  Administered 2018-04-21: 08:00:00 via INTRAVENOUS

## 2018-04-21 MED ORDER — HYDROMORPHONE HCL 1 MG/ML IJ SOLN: 0.2500 mg | INTRAMUSCULAR | Status: DC | PRN

## 2018-04-21 MED ORDER — ACETAMINOPHEN 160 MG/5ML PO SOLN: 650.0000 mg | ORAL | Status: DC | PRN

## 2018-04-21 MED ORDER — SODIUM CHLORIDE 0.9 % IV SOLN
INTRAVENOUS | Status: DC
  Administered 2018-04-21: 21:00:00 via INTRAVENOUS

## 2018-04-21 MED ORDER — ACETAMINOPHEN 325 MG PO TABS: 650.0000 mg | ORAL_TABLET | ORAL | Status: DC | PRN

## 2018-04-21 MED ORDER — HEPARIN (PORCINE) IN NACL 100-0.45 UNIT/ML-% IJ SOLN
700.0000 [IU]/h | INTRAMUSCULAR | Status: DC
Start: 2018-04-21 — End: 2018-04-22
  Administered 2018-04-21: 700 [IU]/h via INTRAVENOUS
  Filled 2018-04-21: qty 250

## 2018-04-21 MED ORDER — OXYCODONE HCL 5 MG/5ML PO SOLN: 5.0000 mg | Freq: Once | ORAL | Status: DC | PRN

## 2018-04-21 MED ORDER — FENTANYL CITRATE (PF) 100 MCG/2ML IJ SOLN
INTRAMUSCULAR | Status: DC | PRN
  Administered 2018-04-21 (×4): 50 ug via INTRAVENOUS

## 2018-04-21 NOTE — H&P (Signed)
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  Referring Physician(s): Deveshwar,Sanjeev  Supervising Physician: Luanne Bras  Patient Status: [facility] - Out-pt  History of Present Illness: Jacob Yoder is a 62 y.o. male with a past medical history of hypertension, hyperlipidemia, TIA 03/2018, DDD, and tobacco abuse. He presented to ED 03/22/2018 with complaints of blurred vision and numbness of lips and tongue which at this time were intermittent over the past few days. His symptoms subsided and he was diagnosed with a TIA. While he was being evaluated for a stroke, he had incidental findings of two intracranial aneurysms.  MRI brain 03/23/2018: 1. Borderline atrophy.  Mild chronic microvascular ischemic change. 2. No acute stroke, visible hemorrhage, or mass lesion. 3. Bulbous enlargement at the RIGHT MCA bifurcation/trifurcation, could represent a saccular aneurysm. CTA head neck recommended for further evaluation.  CTA head/neck 03/23/2018: 1. 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 The sac is pointed. No acute hemorrhage. 2. 2 mm right posterior communicating aneurysm or infundibulum. 3. Overall mild atherosclerosis.  No flow limiting stenosis.  Since he was discharged from the ED 03/23/2018, he was seen by Dr. Estanislado Pandy in consultation 03/30/2018 to discuss management of his two intracranial aneurysms.  Patient presents today for image-guided cerebral angiogram with possible embolization of right MCA bifurcation aneurysm AND right PCOM aneurysm with Dr. Estanislado Pandy. Patient awake and alert sitting in bed with no complaints at this time. Accompanied by sister at bedside. Denies fever, headache, dizziness, weakness, numbness/tingling, vision changes, hearing changes, tinnitus, or speech difficulty.  Patient is currently taking Plavix 75 mg once daily and Aspirin 325 mg once daily.  Past Medical  History:  Diagnosis Date  . DDD (degenerative disc disease), lumbar   . HOH (hard of hearing)    both hearin is diminished  . Hyperlipidemia   . Hypertension   . Smoker   . Stroke [facility])    came in may 20-21 for sx of tia  . Tinnitus    work related    Past Surgical History:  Procedure Laterality Date  . COLONOSCOPY W/ POLYPECTOMY    . HAND SURGERY     right; ring finger amputated  . IR RADIOLOGIST EVAL & MGMT  03/30/2018  . PYLOROPLASTY     back in the 1980's  . TONSILLECTOMY    . WISDOM TOOTH EXTRACTION      Allergies: Patient has no known allergies.  Medications: Prior to Admission medications   Medication Sig Start Date End Date Taking? Authorizing Provider  acetaminophen (TYLENOL) 650 MG CR tablet Take 1,300 mg by mouth every evening.    [provider]  aspirin EC 325 MG tablet Take 1 tablet (325 mg total) by mouth daily. 04/15/18   Venancio Poisson, NP  clopidogrel (PLAVIX) 75 MG tablet Take 1 tablet (75 mg total) by mouth daily. 03/25/18   Susy Frizzle, MD  lisinopril (PRINIVIL,ZESTRIL) 20 MG tablet Take 1 tablet (20 mg total) by mouth daily. Patient taking differently: Take 40 mg by mouth daily.  03/25/18   Susy Frizzle, MD  Multiple Vitamin (MULTIVITAMIN) tablet Take 1 tablet by mouth daily.    [provider]  rosuvastatin (CRESTOR) 40 MG tablet Take 1 tablet (40 mg total) by mouth daily. 03/25/18   Susy Frizzle, MD  vitamin B-12 (CYANOCOBALAMIN) 500 MCG tablet Take 500 mcg by mouth daily.    [provider]     [facility]  History  Problem Relation Age of Onset  . Heart disease Paternal Grandfather   . Hyperlipidemia Mother   . Heart disease Father   . Cancer Father   . Hypertension Sister   . Hyperlipidemia Sister   . Colon cancer Neg Hx     Social History   Socioeconomic History  . Marital status: Divorced    Spouse name: Not on file  . Number of children: Not on file  . Years of education: Not on file  .  Highest education level: Not on file  Occupational History  . Not on file  Social Needs  . Financial resource strain: Not on file  . Food insecurity:    Worry: Not on file    Inability: Not on file  . Transportation needs:    Medical: Not on file    Non-medical: Not on file  Tobacco Use  . Smoking status: Current Every Day Smoker    Packs/day: 0.50    Years: 35.00    Pack years: 17.50    Types: Cigarettes  . Smokeless tobacco: Never Used  Substance and Sexual Activity  . Alcohol use: Yes    Alcohol/week: 4.8 oz    Types: 8 Cans of beer per week    Comment: weekends only  . Drug use: No  . Sexual activity: Not on file  Lifestyle  . Physical activity:    Days per week: Not on file    Minutes per session: Not on file  . Stress: Not on file  Relationships  . Social connections:    Talks on phone: Not on file    Gets together: Not on file    Attends religious service: Not on file    Active member of club or organization: Not on file    Attends meetings of clubs or organizations: Not on file    Relationship status: Not on file  Other Topics Concern  . Not on file  Social History Narrative  . Not on file     Review of Systems: A 12 point ROS discussed and pertinent positives are indicated in the HPI above.  All other systems are negative.  Review of Systems  Constitutional: Negative for chills and fever.  HENT: Negative for hearing loss and tinnitus.   Eyes: Negative for visual disturbance.  Respiratory: Negative for shortness of breath and wheezing.   Cardiovascular: Negative for chest pain and palpitations.  Neurological: Negative for dizziness, speech difficulty, weakness, numbness and headaches.  Psychiatric/Behavioral: Negative for behavioral problems and confusion.    Vital Signs: There were no vitals taken for this visit.  Physical Exam  Constitutional: He is oriented to person, place, and time. He appears well-developed and well-nourished. No distress.    Cardiovascular: Normal rate, regular rhythm and normal heart sounds.  No murmur heard. Pulmonary/Chest: Effort normal and breath sounds normal. No respiratory distress. He has no wheezes.  Neurological: He is alert and oriented to person, place, and time.  Skin: Skin is warm and dry.  Psychiatric: He has a normal mood and affect. His behavior is normal. Judgment and thought content normal.  Nursing note and vitals reviewed.    MD Evaluation Airway: WNL Heart: WNL Abdomen: WNL Chest/ Lungs: WNL ASA  Classification: 3 Mallampati/Airway Score: Two   Imaging: Ct Angio Head W Or Wo Contrast  Result Date: 03/23/2018 CLINICAL DATA:  Arterial stricture/occlusion, head neck Patient presented with blurriness for 2 days and perioral numbness. A brain MRI showed right MCA aneurysm. EXAM: CT  ANGIOGRAPHY HEAD AND NECK TECHNIQUE: Multidetector CT imaging of the head and neck was performed using the standard protocol during bolus administration of intravenous contrast. Multiplanar CT image reconstructions and MIPs were obtained to evaluate the vascular anatomy. Carotid stenosis measurements (when applicable) are obtained utilizing NASCET criteria, using the distal internal carotid diameter as the denominator. CONTRAST:  63mL ISOVUE-370 IOPAMIDOL (ISOVUE-370) INJECTION 76% COMPARISON:  Head CT from yesterday.  Brain MRI from earlier today. FINDINGS: CTA NECK FINDINGS Aortic arch: Atherosclerotic calcification. Three vessel branching. No dilatation or dissection. Right carotid system: Vessels are smooth and diffusely patent. No noted atheromatous changes. Left carotid system: Vessels are smooth and diffusely patent. Mild atherosclerotic plaque at the ICA bulb and common carotid origin. Vertebral arteries: Left subclavian origin atherosclerosis. No subclavian stenosis. The left vertebral artery is strongly dominant. When accounting for streak artifactand mild deformity by osteophytes the vertebral arteries are  smooth and diffusely patent. Skeleton: Diffuse cervical disc degeneration with high-grade narrowing from C3-4 to C6-7. No acute or aggressive finding. Other neck: No acute finding. Upper chest: No acute finding. Few tiny nodular densities in the right upper lobe are clustered, suggesting inflammatory process. Review of the MIP images confirms the above findings CTA HEAD FINDINGS Anterior circulation: Overall mild atherosclerotic plaque on the carotid siphons. Hypoplastic right A1 segment. There is a superior and anteriorly directed right MCA bifurcation aneurysm with pointed appearance at the apex. The aneurysm has a broad neck. In maximal span the sac measures 6 mm. See 3D reformats. 2 mm outpouching from the right supraclinoid ICA. The origin of the posterior communicating artery arises from or near the sac. Posterior circulation: Tiny right vertebral artery with minimal contribution to the basilar. Proximal basilar fenestration. No branch occlusion or beading. Negative for aneurysm. Venous sinuses: Patent Anatomic variants: As above Delayed phase: No abnormal intracranial enhancement. Review of the MIP images confirms the above findings IMPRESSION: 1. 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 The sac is pointed. No acute hemorrhage. 2. 2 mm right posterior communicating aneurysm or infundibulum. 3. Overall mild atherosclerosis.  No flow limiting stenosis. Electronically Signed   By: Monte Fantasia M.D.   On: 03/23/2018 16:01   Ct Head Wo Contrast  Result Date: 03/22/2018 CLINICAL DATA:  Blurry vision 2 days ago EXAM: CT HEAD WITHOUT CONTRAST TECHNIQUE: Contiguous axial images were obtained from the base of the skull through the vertex without intravenous contrast. COMPARISON:  10/23/2017 FINDINGS: Brain: No evidence of acute infarction, hemorrhage, hydrocephalus, extra-axial collection or mass lesion/mass effect. Vascular: No hyperdense vessel or unexpected calcification. Skull: Normal. Negative  for fracture or focal lesion. Sinuses/Orbits: No acute finding. Other: None. IMPRESSION: No acute intracranial abnormality is noted. Electronically Signed   By: Inez Catalina M.D.   On: 03/22/2018 10:39   Ct Angio Neck W And/or Wo Contrast  Result Date: 03/23/2018 CLINICAL DATA:  Arterial stricture/occlusion, head neck Patient presented with blurriness for 2 days and perioral numbness. A brain MRI showed right MCA aneurysm. EXAM: CT ANGIOGRAPHY HEAD AND NECK TECHNIQUE: Multidetector CT imaging of the head and neck was performed using the standard protocol during bolus administration of intravenous contrast. Multiplanar CT image reconstructions and MIPs were obtained to evaluate the vascular anatomy. Carotid stenosis measurements (when applicable) are obtained utilizing NASCET criteria, using the distal internal carotid diameter as the denominator. CONTRAST:  105mL ISOVUE-370 IOPAMIDOL (ISOVUE-370) INJECTION 76% COMPARISON:  Head CT from yesterday.  Brain MRI from earlier today. FINDINGS: CTA NECK FINDINGS Aortic arch:  Atherosclerotic calcification. Three vessel branching. No dilatation or dissection. Right carotid system: Vessels are smooth and diffusely patent. No noted atheromatous changes. Left carotid system: Vessels are smooth and diffusely patent. Mild atherosclerotic plaque at the ICA bulb and common carotid origin. Vertebral arteries: Left subclavian origin atherosclerosis. No subclavian stenosis. The left vertebral artery is strongly dominant. When accounting for streak artifactand mild deformity by osteophytes the vertebral arteries are smooth and diffusely patent. Skeleton: Diffuse cervical disc degeneration with high-grade narrowing from C3-4 to C6-7. No acute or aggressive finding. Other neck: No acute finding. Upper chest: No acute finding. Few tiny nodular densities in the right upper lobe are clustered, suggesting inflammatory process. Review of the MIP images confirms the above findings CTA HEAD  FINDINGS Anterior circulation: Overall mild atherosclerotic plaque on the carotid siphons. Hypoplastic right A1 segment. There is a superior and anteriorly directed right MCA bifurcation aneurysm with pointed appearance at the apex. The aneurysm has a broad neck. In maximal span the sac measures 6 mm. See 3D reformats. 2 mm outpouching from the right supraclinoid ICA. The origin of the posterior communicating artery arises from or near the sac. Posterior circulation: Tiny right vertebral artery with minimal contribution to the basilar. Proximal basilar fenestration. No branch occlusion or beading. Negative for aneurysm. Venous sinuses: Patent Anatomic variants: As above Delayed phase: No abnormal intracranial enhancement. Review of the MIP images confirms the above findings IMPRESSION: 1. 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 The sac is pointed. No acute hemorrhage. 2. 2 mm right posterior communicating aneurysm or infundibulum. 3. Overall mild atherosclerosis.  No flow limiting stenosis. Electronically Signed   By: Monte Fantasia M.D.   On: 03/23/2018 16:01   Mr Brain Wo Contrast  Result Date: 03/23/2018 CLINICAL DATA:  Blurry vision 2 days ago.  Perioral numbness. EXAM: MRI HEAD WITHOUT CONTRAST TECHNIQUE: Multiplanar, multiecho pulse sequences of the brain and surrounding structures were obtained without intravenous contrast. COMPARISON:  CT head 03/22/2018. FINDINGS: Brain: No acute infarction, hemorrhage, hydrocephalus, extra-axial collection or mass lesion. Borderline atrophy. T2 and FLAIR hyperintensities throughout the periventricular and subcortical white matter, likely chronic microvascular ischemic change. Vascular: Flow voids are maintained. There is bulbous enlargement at the RIGHT MCA trifurcation/bifurcation, 5 x 6 mm, potentially representing a saccular aneurysm. No similar findings elsewhere. Skull and upper cervical spine: Normal marrow signal. Sinuses/Orbits: No layering fluid.  BILATERAL ethmoid mucosal thickening. Negative orbits. Other: Trace LEFT mastoid fluid, non worrisome. IMPRESSION: Borderline atrophy.  Mild chronic microvascular ischemic change. No acute stroke, visible hemorrhage, or mass lesion. Bulbous enlargement at the RIGHT MCA bifurcation/trifurcation, could represent a saccular aneurysm. CTA head neck recommended for further evaluation. Electronically Signed   By: Staci Righter M.D.   On: 03/23/2018 11:03   Ir Radiologist Eval & Mgmt  Result Date: 03/31/2018 EXAM: NEW PATIENT OFFICE VISIT CHIEF COMPLAINT: TIAs. Discovery of at least two intracranial aneurysms on CT angiogram workup. Current Pain Level: 1-10 HISTORY OF PRESENT ILLNESS: The patient is a 62 year old right-handed gentleman who has been referred for evaluation and management of two recently discovered unruptured intracranial aneurysms. History was obtained from the patient, and also from the medical charts. The patient presented to the ER at [facility] on 03/23/2018 with symptoms of intermittent perioral numbness, tongue numbness, and visual changes. The latter he describes is blurred vision without blindness or visual field deficits, or of double vision. These appeared to last for only a few minutes. The longest one of these episodes  lasted was 4 minutes as per history. This is also associated with some facial numbness centrally. He denies any associated symptoms of motor weakness or tingling during these spells. He does, however, report difficulty with balance during these spells. He denies any associated palpitations or chest pains with the symptoms. He denies any speech difficulty, swallowing difficulty, hearing difficulty, during these spells. Over the past few days he reports having had no similar symptoms. Patient denies any episodes of loss of consciousness or of seizure-like activity. He denies any chest pains, palpitations, or paroxysmal nocturnal dyspnea. He has no cough or wheezing, or  of coughing up blood. His a appetite is normal. Weight is steady. Denies any difficulty swallowing, reflux, abdominal pain with eating, or constipation or diarrhea. No bloody stools. Denies symptoms of dysuria, hematuria or polyuria. Past Medical History: Degenerate disc disease of the lumbar spine. Hyperlipidemia. Previous history of atypical chest pain for which he underwent a stress test and was reportedly unremarkable as per patient. Past Surgical History: Polypectomy during colonoscopy. Right ring finger amputated. Medications: Plavix 75 mg a day. Crestor and blood pressure medicine that he can't remember. Allergies: No known allergies. Social History: Patient does a shift job during evenings. This does entail heavy lifting at times as per patient. He has 2 sisters alive and well. One sister is a PA in [place]. Patient drinks 1-2 beers a day. Smokes up to a half a pack of cigarettes per day. Denies use of illicit chemicals. Is on no special diet. Family History: Heart problems. No history of strokes or intracranial hemorrhages or of aneurysms intracranially or extra cranially. REVIEW OF SYSTEMS: Negative unless the as mentioned above. PHYSICAL EXAMINATION: Appears somewhat anxious. However, affect appropriate to the situation. Neurologically alert, awake, oriented to time, place, space. No lateralizing cranial nerve, motor, sensory or coordination or gait abnormalities. ASSESSMENT AND PLAN: The patient's recent CT angiogram of the head and neck with the 3D reconstruction was reviewed with him. Also involved in the patient's discussion was his sister from [place] who is a PA. The patient gave information for the sister to be involved during this time. They were informed of the approximately 6 mm x 5 mm right MCA bifurcation region aneurysm with a probable wide neck. Also brought to their attention was the approximately 2.1 mm right posterior communicating artery region outpouching also suspicious of an  aneurysm. The patient and the sister were informed that the discovery of the aneurysms was an incidental finding and/or not contributing to the patient's symptoms. The natural history of unruptured intracranial aneurysms was discussed fully with the patient and his sister. The risk of rupture of 1-2% per year for each aneurysm, with the attendant significant mortality and morbidity with ruptured intracranial aneurysms was reviewed in detail. Increased risk of rupture associated with hypertension and smoking was again stressed. There is no family history of intracranial aneurysms. The patient denies using any illicit chemicals. The main considerations were those of elimination of these aneurysms especially the right middle cerebral artery one from the circulation with either endovascular means or surgical clipping to eliminate the risk of growth and/or rupture versus continued surveillance with neuro imaging was reviewed in detail. The patient and the sister expressed their desire to have aneurysms treated endovascularly. The endovascular option was also reviewed in detail. Depending on a diagnostic catheter arteriogram, endovascular options may entail primary coiling versus stent assisted coiling versus less likely use of a flow diverter for the right middle cerebral artery aneurysm, and for  the right posterior cerebral artery aneurysm, options were similar or waiting and watching. The endovascular procedure with risks of 4-6% of a complication which could include thrombotic stroke, versus remote possibility of intraprocedural rupture with a potential fatality was reviewed in detail as well. The patient and his sister were informed of the need for a diagnostic catheter arteriogram with sedation for more accurate information regarding the angio architecture of these two aneurysms prior to proceeding with the endovascular treatment depending on the findings. Questions were answered to their satisfaction. The patient  expressed his desire of wanting to have the aneurysm treated endovascularly which was understood by the sister as well. They were both informed that the patient will be started on aspirin 325 mg a day, and Plavix 75 mg a day at least 7 days prior to the procedure. In the meantime, the patient was asked to stop smoking and refrain from heavy exertional activities such as lifting heavy weights. They both expressed understanding and agreement with the above management plan. The patient's treatment will be scheduled as soon as possible. Electronically Signed   By: Luanne Bras M.D.   On: 03/30/2018 14:03    Labs:  CBC: Recent Labs    03/22/18 1017 03/22/18 1045 04/19/18 0938  WBC 5.5  --  10.3  HGB 16.2 16.7 15.7  HCT 48.2 49.0 47.4  PLT 250  --  250    COAGS: Recent Labs    03/22/18 1017 04/19/18 0938  INR 1.01 1.08  APTT 29  --     BMP: Recent Labs    03/22/18 1017 03/22/18 1045 04/19/18 0938  NA 138 139 140  K 4.4 4.4 4.8  CL 103 104 108  CO2 24  --  25  GLUCOSE 116* 111* 104*  BUN 17 20 18   CALCIUM 9.4  --  10.0  CREATININE 0.84 0.70 0.84  GFRNONAA >60  --  >60  GFRAA >60  --  >60    LIVER FUNCTION TESTS: Recent Labs    03/22/18 1017  BILITOT 0.6  AST 23  ALT 20  ALKPHOS 67  PROT 7.1  ALBUMIN 4.2    TUMOR MARKERS: No results for input(s): AFPTM, CEA, CA199, CHROMGRNA in the last 8760 hours.  Assessment and Plan:  Right MCA bifurcation aneurysm. Right PCOM aneurysm. Plan for image-guided cerebral angiogram with possible embolization of right MCA bifurcation aneurysm and right PCOM aneurysm this AM with Dr. Estanislado Pandy. Patient is NPO. Denies fever and WBCs WNL. INR 1.08 seconds 04/19/2018. P2Y12 76 PRU this AM.  Risks and benefits of cerebral angiogram with intervention were discussed with the patient including, but not limited to bleeding, infection, vascular injury, contrast induced renal failure, stroke or even death. This interventional  procedure involves the use of X-rays and because of the nature of the planned procedure, it is possible that we will have prolonged use of X-ray fluoroscopy. Potential radiation risks to you include (but are not limited to) the following: - A slightly elevated risk for cancer  several years later in life. This risk is typically less than 0.5% percent. This risk is low in comparison to the normal incidence of human cancer, which is 33% for women and 50% for men according to the [place]. - Radiation induced injury can include skin redness, resembling a rash, tissue breakdown / ulcers and hair loss (which can be temporary or permanent).  The likelihood of either of these occurring depends on the difficulty of the procedure and whether you  are sensitive to radiation due to previous procedures, disease, or genetic conditions.  IF your procedure requires a prolonged use of radiation, you will be notified and given written instructions for further action.  It is your responsibility to monitor the irradiated area for the 2 weeks following the procedure and to notify your physician if you are concerned that you have suffered a radiation induced injury.   All of the patient's questions were answered, patient is agreeable to proceed. Consent signed and in chart.   Thank you for this interesting consult.  I greatly enjoyed meeting GILDO CRISCO and look forward to participating in their care.  A copy of this report was sent to the requesting provider on this date.  Electronically Signed: Earley Abide, PA-C 04/21/2018, 9:11 AM   I spent a total of 25 Minutes in face to face in clinical consultation, greater than 50% of which was counseling/coordinating care for right MCA bifurcation aneurysm AND right PCOM aneurysm.

## 2018-04-21 NOTE — Sedation Documentation (Signed)
V-pad applied

## 2018-04-21 NOTE — Progress Notes (Addendum)
ANTICOAGULATION CONSULT NOTE - Initial Consult  Pharmacy Consult for heparin Indication: Post Interventional Neuroradiology Procedure  No Known Allergies  Patient Measurements: Height: 5' 7.5" (171.5 cm) Weight: 161 lb (73 kg) IBW/kg (Calculated) : 67.25   Vital Signs: Temp: 98 F (36.7 C) (06/19 1220) Temp Source: Oral (06/19 0705) BP: 107/76 (06/19 1350) Pulse Rate: 93 (06/19 1350)  Labs: Recent Labs    04/19/18 0938  HGB 15.7  HCT 47.4  PLT 250  LABPROT 13.9  INR 1.08  CREATININE 0.84    Estimated Creatinine Clearance: 85.7 mL/min (by C-G formula based on SCr of 0.84 mg/dL).   Medical History: Past Medical History:  Diagnosis Date  . DDD (degenerative disc disease), lumbar   . HOH (hard of hearing)    both hearin is diminished  . Hyperlipidemia   . Hypertension   . Smoker   . Stroke [facility])    came in may 20-21 for sx of tia  . Tinnitus    work related    Assessment: 62 yo male with intracranial aneurysm s/p stent assisted coiling. Pharmacy consulted to dose heparin. Initial heparin rate was 500 units/hr started in PACT. Heparin to be off on 6/17 at 8am. No oral anticoagulants noted PTA.   Goal of Therapy:  Heparin level 0.1-0.25 units/ml Monitor platelets by anticoagulation protocol: Yes   Plan:   -Increase heparin to 700 units/hr -Heparin level in 6 hours and daily wth CBC daily  Hildred Laser, PharmD Clinical Pharmacist Clinical phone from 8:30-4:00 is x2- After 4pm, please call Main Rx [phone]) for assistance. 04/21/2018 2:31 PM

## 2018-04-21 NOTE — Procedures (Signed)
S/P 4 vessel cerebral arteriogram followed by endovascular treatment of RT MCA 6.84mm x 4.8 mm irregular  aneurysm with y stent assisted coiling

## 2018-04-21 NOTE — H&P (Deleted)
  The note originally documented on this encounter has been moved the the encounter in which it belongs.  

## 2018-04-21 NOTE — Progress Notes (Signed)
Saw patient in PACU following procedure. Patient underwent image-guided cerebral angiogram with embolization of right MCA irregular aneurysm this AM with Dr. Estanislado Pandy.  Patient awake and alert laying in bed with no complaints at this time. Denies headache, numbness/tingling, weakness, dizziness, vision changes, speech difficulty, hearing changes, or tinnitus.  Alert, awake, and oriented x3. Speech and comprehension intact. PERRL 2.5 bilaterally and sluggish. EOMs intact bilaterally without nystagmus or subjective diplopia. Visual fields not assessed. No facial asymmetry. Tongue midline. Motor power symmetric proportional to effort. No pronator drift. Fine motor and coordination intact and symmetric. Gait not assessed. Romberg not assessed. Heel to toe not assessed. Distal pulses 2+ bilaterally. Right groin soft without active bleeding or hematoma.  Plan to transfer to neuro ICU for overnight observation. Continue with routine BP and neuro checks. Per Dr. Estanislado Pandy, patient to have one dose of Plavix 75 mg this evening. NS changed to 90 cc/hr. IR to follow.  Bea Graff Emmanuela Ghazi, PA-C 04/21/2018, 4:36 PM

## 2018-04-21 NOTE — Transfer of Care (Signed)
Immediate Anesthesia Transfer of Care Note  Patient: Jacob Yoder  Procedure(s) Performed: EMBOLIZATION (N/A )  Patient Location: PACU  Anesthesia Type:General  Level of Consciousness: awake, alert , oriented and patient cooperative  Airway & Oxygen Therapy: Patient Spontanous Breathing and Patient connected to nasal cannula oxygen  Post-op Assessment: Report given to RN and Post -op Vital signs reviewed and stable  Post vital signs: Reviewed and stable  Last Vitals:  Vitals Value Taken Time  BP 106/68 04/21/2018 12:20 PM  Temp    Pulse 89 04/21/2018 12:21 PM  Resp 12 04/21/2018 12:21 PM  SpO2 98 % 04/21/2018 12:21 PM  Vitals shown include unvalidated device data.  Last Pain:  Vitals:   04/21/18 0758  TempSrc:   PainSc: 0-No pain         Complications: No apparent anesthesia complications

## 2018-04-21 NOTE — Addendum Note (Signed)
Addendum  created 04/21/18 1443 by Laretta Alstrom, CRNA   Child order released for a procedure order, Intraprocedure Blocks edited, LDA created via procedure documentation, Sign clinical note

## 2018-04-21 NOTE — Progress Notes (Addendum)
IR called to check in on pt. BP's were lower than systolic goal of 681P-947M. 542mL bolus ordered and given. Current BP reading 130s via arterial line and 114/74 via cuff. Dr. Estanislado Pandy notified and stated that as long as pt is neurologically intact he does not need to be notified unless blood pressure drops below 761 systolic.

## 2018-04-21 NOTE — Anesthesia Postprocedure Evaluation (Signed)
Anesthesia Post Note  Patient: Jacob Yoder  Procedure(s) Performed: EMBOLIZATION (N/A )     Patient location during evaluation: PACU Anesthesia Type: General Level of consciousness: awake and alert Pain management: pain level controlled Vital Signs Assessment: post-procedure vital signs reviewed and stable Respiratory status: spontaneous breathing, nonlabored ventilation and respiratory function stable Cardiovascular status: blood pressure returned to baseline and stable Postop Assessment: no apparent nausea or vomiting Anesthetic complications: no    Last Vitals:  Vitals:   04/21/18 1305 04/21/18 1320  BP: 105/71 107/71  Pulse: 87 84  Resp: 12 11  Temp:    SpO2: 93% 93%    Last Pain:  Vitals:   04/21/18 1320  TempSrc:   PainSc: 0-No pain                 Lynda Rainwater

## 2018-04-21 NOTE — Anesthesia Preprocedure Evaluation (Signed)
Anesthesia Evaluation  Patient identified by MRN, date of birth, ID band Patient awake    Reviewed: Allergy & Precautions, NPO status , Patient's Chart, lab work & pertinent test results  Airway Mallampati: II  TM Distance: >3 FB Neck ROM: Full    Dental no notable dental hx.    Pulmonary neg pulmonary ROS, Current Smoker,    Pulmonary exam normal breath sounds clear to auscultation       Cardiovascular hypertension, Pt. on medications negative cardio ROS Normal cardiovascular exam Rhythm:Regular Rate:Normal     Neuro/Psych CVA negative neurological ROS  negative psych ROS   GI/Hepatic negative GI ROS, Neg liver ROS,   Endo/Other  negative endocrine ROS  Renal/GU negative Renal ROS  negative genitourinary   Musculoskeletal negative musculoskeletal ROS (+)   Abdominal   Peds negative pediatric ROS (+)  Hematology negative hematology ROS (+)   Anesthesia Other Findings   Reproductive/Obstetrics negative OB ROS                             Anesthesia Physical Anesthesia Plan  ASA: III  Anesthesia Plan: General   Post-op Pain Management:    Induction: Intravenous  PONV Risk Score and Plan: 1 and Ondansetron  Airway Management Planned: Oral ETT  Additional Equipment:   Intra-op Plan:   Post-operative Plan: Extubation in OR  Informed Consent: I have reviewed the patients History and Physical, chart, labs and discussed the procedure including the risks, benefits and alternatives for the proposed anesthesia with the patient or authorized representative who has indicated his/her understanding and acceptance.   Dental advisory given  Plan Discussed with: CRNA  Anesthesia Plan Comments:         Anesthesia Quick Evaluation

## 2018-04-21 NOTE — Anesthesia Procedure Notes (Signed)
Arterial Line Insertion Start/End6/19/2019 8:45 AM, 04/21/2018 8:50 AM Performed by: Laretta Alstrom, CRNA, CRNA  Patient location: Pre-op. Preanesthetic checklist: patient identified, IV checked, site marked, risks and benefits discussed, surgical consent, monitors and equipment checked, pre-op evaluation, timeout performed and anesthesia consent Lidocaine 1% used for infiltration Left, radial was placed Catheter size: 20 G Maximum sterile barriers used   Attempts: 1 Procedure performed without using ultrasound guided technique. Following insertion, dressing applied and Biopatch. Patient tolerated the procedure well with no immediate complications.

## 2018-04-21 NOTE — Sedation Documentation (Signed)
Sheath pulled, 6Fr closure device used. Manual pressure being held at right groin site.

## 2018-04-22 ENCOUNTER — Telehealth (HOSPITAL_COMMUNITY): Payer: Self-pay

## 2018-04-22 ENCOUNTER — Encounter (HOSPITAL_COMMUNITY): Payer: Self-pay | Admitting: Interventional Radiology

## 2018-04-22 ENCOUNTER — Other Ambulatory Visit: Payer: Self-pay | Admitting: Radiology

## 2018-04-22 DIAGNOSIS — I671 Cerebral aneurysm, nonruptured: Secondary | ICD-10-CM

## 2018-04-22 LAB — CBC WITH DIFFERENTIAL/PLATELET
Abs Immature Granulocytes: 0 10*3/uL (ref 0.0–0.1)
BASOS PCT: 1 %
Basophils Absolute: 0 10*3/uL (ref 0.0–0.1)
EOS ABS: 0.3 10*3/uL (ref 0.0–0.7)
EOS PCT: 4 %
HEMATOCRIT: 36 % — AB (ref 39.0–52.0)
Hemoglobin: 11.9 g/dL — ABNORMAL LOW (ref 13.0–17.0)
IMMATURE GRANULOCYTES: 0 %
LYMPHS ABS: 2.3 10*3/uL (ref 0.7–4.0)
Lymphocytes Relative: 32 %
MCH: 34.4 pg — ABNORMAL HIGH (ref 26.0–34.0)
MCHC: 33.1 g/dL (ref 30.0–36.0)
MCV: 104 fL — ABNORMAL HIGH (ref 78.0–100.0)
Monocytes Absolute: 0.3 10*3/uL (ref 0.1–1.0)
Monocytes Relative: 5 %
NEUTROS PCT: 58 %
Neutro Abs: 4.1 10*3/uL (ref 1.7–7.7)
PLATELETS: 206 10*3/uL (ref 150–400)
RBC: 3.46 MIL/uL — ABNORMAL LOW (ref 4.22–5.81)
RDW: 12.3 % (ref 11.5–15.5)
WBC: 7.1 10*3/uL (ref 4.0–10.5)

## 2018-04-22 LAB — BASIC METABOLIC PANEL
Anion gap: 7 (ref 5–15)
BUN: 10 mg/dL (ref 6–20)
CALCIUM: 8.3 mg/dL — AB (ref 8.9–10.3)
CHLORIDE: 113 mmol/L — AB (ref 101–111)
CO2: 23 mmol/L (ref 22–32)
CREATININE: 0.65 mg/dL (ref 0.61–1.24)
GFR calc non Af Amer: >60 mL/min (ref >60)
Glucose, Bld: 113 mg/dL — ABNORMAL HIGH (ref 65–99)
Potassium: 3.8 mmol/L (ref 3.5–5.1)
SODIUM: 143 mmol/L (ref 135–145)

## 2018-04-22 LAB — HEPARIN LEVEL (UNFRACTIONATED): Heparin Unfractionated: <0.1 IU/mL — ABNORMAL LOW (ref 0.30–0.70)

## 2018-04-22 LAB — PLATELET INHIBITION P2Y12: Platelet Function  P2Y12: 106 [PRU] — ABNORMAL LOW (ref 194–418)

## 2018-04-22 MED ORDER — HEPARIN (PORCINE) IN NACL 100-0.45 UNIT/ML-% IJ SOLN
850.0000 [IU]/h | INTRAMUSCULAR | Status: DC
  Administered 2018-04-22: 850 [IU]/h via INTRAVENOUS
  Filled 2018-04-22: qty 250

## 2018-04-22 MED ORDER — LORAZEPAM 2 MG/ML IJ SOLN
1.0000 mg | Freq: Once | INTRAMUSCULAR | Status: AC
  Administered 2018-04-22: 1 mg via INTRAVENOUS
  Filled 2018-04-22: qty 1

## 2018-04-22 NOTE — Discharge Summary (Signed)
Physician Discharge Summary      Patient ID: Jacob Yoder MRN: [id number] DOB/AGE: April 25, 1956 62 y.o.  Admit date: 04/21/2018 Discharge date: 04/22/2018  Admission Diagnoses: Active Problems:   Brain aneurysm  Discharge Diagnoses:  Active Problems:   Brain aneurysm    Procedures: Procedure(s): 4 vessel cerebral arteriogram followed by endovascular treatment of RT MCA 6.61mm x 4.8 mm irregular aneurysm with Y stent assisted coiling   Discharged Condition: good  Hospital Course: Pt admitted after successful procedure as above under general anesthesia. No immediate post procedure complications. Pt take to Neuro ICU in stable condition. No events or symptoms overnight. Pt tolerating regular diet. Neuro exam is intact without focal findings. Pt stable for discharge All instructions, restrictions, medications, and follow up plans were discussed at length. Will follow up in 2 weeks.  Consults: None   Discharge Exam: Blood pressure (!) 130/91, pulse 77, temperature 98.1 F (36.7 C), temperature source Oral, resp. rate 14, height 5\' 7"  (1.702 m), weight 161 lb 6 oz (73.2 kg), SpO2 97 %. A&O x 3 PERRLA, EOMI Face symmetric, tongue midline CN II-XII intact, no deficits No drift. Fine motor intact Strength 5/5 Heart: Regular Lungs: CTA  (R)groin soft, NT, no hematoma Feet warm, excellent pulses   Disposition: Discharge disposition: 01-Home or Self Care       Discharge Instructions    Call MD for:  difficulty breathing, headache or visual disturbances   Complete by:  As directed    Call MD for:  persistant nausea and vomiting   Complete by:  As directed    Call MD for:  redness, tenderness, or signs of infection (pain, swelling, redness, odor or green/yellow discharge around incision site)   Complete by:  As directed    Call MD for:  severe uncontrolled pain   Complete by:  As directed    Call MD for:  temperature >100.4   Complete by:  As directed    Diet - low sodium heart healthy   Complete by:  As directed    Increase activity slowly   Complete by:  As directed    Lifting restrictions   Complete by:  As directed    No bending, stooping or lifting greater than 10 lbs for 2 weeks.   May shower / Bathe   Complete by:  As directed    May walk up steps   Complete by:  As directed    Remove dressing in 24 hours   Complete by:  As directed      Allergies as of 04/22/2018   No Known Allergies     Medication List    TAKE these medications   acetaminophen 650 MG CR tablet Commonly known as:  TYLENOL Take 1,300 mg by mouth every evening.   aspirin EC 325 MG tablet Take 1 tablet (325 mg total) by mouth daily.   clopidogrel 75 MG tablet Commonly known as:  PLAVIX Take 1 tablet (75 mg total) by mouth daily.   lisinopril 20 MG tablet Commonly known as:  PRINIVIL,ZESTRIL Take 1 tablet (20 mg total) by mouth daily. What changed:  how much to take   multivitamin tablet Take 1 tablet by mouth daily.   rosuvastatin 40 MG tablet Commonly known as:  CRESTOR Take 1 tablet (40 mg total) by mouth daily.   vitamin B-12 500 MCG tablet Commonly known as:  CYANOCOBALAMIN Take 500 mcg by mouth daily.      Follow-up Information    Luanne Bras, MD  Follow up in 2 week(s).   Specialties:  Interventional Radiology, Radiology Contact information: [address] [place] [address] [phone]           Signed: Ascencion Dike PA-C 04/22/2018, 10:20 AM

## 2018-04-22 NOTE — Progress Notes (Signed)
ANTICOAGULATION CONSULT NOTE - Follow Up Consult  Pharmacy Consult for heparin Indication: s/p cerebral arteriogram followed by stent-assisted coiling  Labs: Recent Labs    04/19/18 0938 04/21/18 2137  HGB 15.7 11.9*  HCT 47.4 35.1*  PLT 250 199  LABPROT 13.9  --   INR 1.08  --   HEPARINUNFRC  --  <0.10*  CREATININE 0.84  --     Assessment: 62yo male subtherapeutic on heparin with initial dosing s/p IR neuro procedure.  Goal of Therapy:  Heparin level 0.1-0.25 units/ml   Plan:  Will increase heparin gtt by 2 units/kg/hr to 850 units/hr until off at 0800.    Wynona Neat, PharmD, BCPS  04/22/2018,12:13 AM

## 2018-04-22 NOTE — Progress Notes (Signed)
Per Md, Pt SBP okay to be in 160's and to call if less than 110  going by cuff pressure, A line has new whip, per outgoing RN. WCTM

## 2018-04-22 NOTE — Progress Notes (Signed)
Pt awake and alert this am. Feels ok except for poor sleep. Denies HA, N/V, vision changes.  BP (!) 130/91   Pulse 77   Temp 98.1 F (36.7 C) (Oral)   Resp 14   Ht 5\' 7"  (1.702 m)   Wt 161 lb 6 oz (73.2 kg)   SpO2 97%   BMI 25.28 kg/m  A&O x 3 PERRLA, EOMI Face symmetric, tongue midline CN II-XII intact, no deficits No drift. Fine motor intact Strength 5/5  (R)groin soft, NT, no hematoma  S/p 4 vessel cerebral arteriogram followed by endovascular treatment of RT MCA 6.68mm x 4.8 mm irregular  aneurysm with Y stent assisted coiling   DC a line, Foley, OOB DC home later this am.  Ascencion Dike PA-C Interventional Radiology 04/22/2018 9:30 AM

## 2018-04-22 NOTE — Telephone Encounter (Signed)
Called to schedule 2 wk f/u, no answer, left vm. AW 

## 2018-04-22 NOTE — Discharge Instructions (Signed)
Endovascular Therapy for Cerebral Aneurysm, Care After This sheet gives you information about how to care for yourself after your procedure. Your health care provider may also give you more specific instructions. If you have problems or questions, contact your health care provider. What can I expect after the procedure? After the procedure, it is common to have:  Pain, tenderness, and swelling around your groin incision.  Headaches.  Follow these instructions at home: Medicines  Take over-the-counter and prescription medicines only as told by your health care provider.  If you were prescribed medicines to prevent blood clots (antiplatelet medicines), talk with your health care provider about the risks. These medicines can increase bleeding, so you may need to avoid certain activities. Incision care  Follow instructions from your health care provider about how to take care of your incision. Make sure you: ? Wash your hands with soap and water before you change your bandage (dressing). If soap and water are not available, use hand sanitizer. ? Change your dressing as told by your health care provider. ? Leave stitches (sutures), skin glue, or adhesive strips in place. These skin closures may need to stay in place for 2 weeks or longer. If adhesive strip edges start to loosen and curl up, you may trim the loose edges. Do not remove adhesive strips completely unless your health care provider tells you to do that.  Check your incision area every day for signs of infection. Check for: ? Redness, swelling, or pain. ? Fluid or blood. ? Warmth. ? Pus or a bad smell. Activity  Ask your health care provider what activities are safe for you during recovery. Most people can return to normal activities 2-6 weeks after the procedure.  Do not drive until your health care provider approves.  Do not drive or use heavy machinery while taking prescription pain medicine.  Do not lift anything that is  heavier than 10 lb (4.5 kg), or the limit that you are told, until your health care provider says that it is safe.  Exercise regularly, as directed by your health care provider.  Attend rehabilitation therapy as told by your health care provider. This may include: ? Physical and occupational therapy. ? Speech-language therapy. ? Brain exercises. ? Balance exercises. ? Individual or group therapy. ? Education about your condition and treatment. Eating and drinking  Drink enough water to keep your urine pale yellow.  Eat a healthy diet. This includes plenty of fruits and vegetables, whole grains, low-fat dairy products, and lean protein. General instructions  Do not use any products that contain nicotine or tobacco, such as cigarettes and e-cigarettes. If you need help quitting, ask your health care provider.  Do not take baths, swim, or use a hot tub until your health care provider approves. Ask your health care provider if you may take showers. You may only be allowed to take sponge baths for bathing.  Manage your stress. If you need help with this, talk with your health care provider.  Wear compression stockings as told by your health care provider. These stockings help to prevent blood clots and reduce swelling in your legs.  Keep all follow-up visits as told by your health care provider. This is important. Contact a health care provider if:  You have redness, swelling, or pain around your incision.  You have fluid or blood coming from your incision.  Your incision feels warm to the touch.  You have pus or a bad smell coming from your incision.  You  have a fever. Get help right away if:  You have: ? Stiffness in your neck. ? Pain, numbness, weakness, or swelling in your legs. ? Severe chest pain. ? Difficulty breathing. ? Confusion.  You have any symptoms of stroke. "BE FAST" is an easy way to remember the main warning signs of stroke: ? B - Balance. Signs are  dizziness, sudden trouble walking, or loss of balance. ? E - Eyes. Signs are trouble seeing or a sudden change in vision. ? F - Face. Signs are sudden weakness or numbness of the face, or the face or eyelid drooping on one side. ? A - Arms. Signs are weakness or numbness in an arm. This happens suddenly and usually on one side of the body. ? S - Speech. Signs are sudden trouble speaking, slurred speech, or trouble understanding what people say. ? T - Time. Time to call emergency services. Write down what time symptoms started.  You have other signs of stroke, such as: ? A sudden, severe headache that does not get better with medicine. ? Sudden nausea or vomiting. ? A seizure. These symptoms may represent a serious problem that is an emergency. Do not wait to see if the symptoms will go away. Get medical help right away. Call your local emergency services (911 in the [place] Do not drive yourself to the hospital. Summary  After this procedure, it is common to have some pain and swelling around your incision.  Follow instructions from your health care provider about how to take care of your incision. Check for signs of infection every day.  Most people can return to normal activities 2-6 weeks after the procedure. Ask your health care provider what activities are safe for you during recovery.  Do not drive until your health care provider approves. Do not drive while taking prescription pain medicine.  Do not use any products that contain nicotine or tobacco, such as cigarettes and e-cigarettes. If you need help quitting, ask your health care provider. This information is not intended to replace advice given to you by your health care provider. Make sure you discuss any questions you have with your health care provider. Document Released: 01/26/2017 Document Revised: 01/26/2017 Document Reviewed: 01/26/2017 Elsevier Interactive Patient Education  [employer].

## 2018-04-22 NOTE — Progress Notes (Signed)
Pt complaining of insomnia, no hx of this at home. Paged MD for one time order to help pt sleep. 1mg  ativan 1x ordered   Followed up with pt 30 min after receiving medication and pt was resting quietly.  WCTM

## 2018-04-23 ENCOUNTER — Other Ambulatory Visit (HOSPITAL_COMMUNITY): Payer: Self-pay | Admitting: Interventional Radiology

## 2018-04-23 ENCOUNTER — Encounter (HOSPITAL_COMMUNITY): Payer: Self-pay | Admitting: Interventional Radiology

## 2018-04-23 ENCOUNTER — Telehealth: Payer: Self-pay | Admitting: Student

## 2018-04-23 DIAGNOSIS — I671 Cerebral aneurysm, nonruptured: Secondary | ICD-10-CM

## 2018-04-23 NOTE — Progress Notes (Signed)
Patient presented to radiology department this AM requesting to be seen by staff. Patient underwent image-guided cerebral angiogram with embolization of right MCA irregular aneurysm 04/21/2018 with Dr. Estanislado Pandy. He was discharged home 04/22/2018 in stable condition.  Patient states that his sister attempted to take off his groin bandage this AM and the pain from removal was so severe that he developed a 10/10 headache. He took Tylenol which helped, and now rates his headache 5/10. Denies dizziness, weakness, numbness/tingling, vision changes, hearing changes, tinnitus, or speech difficulty. States he came to be evaluated because he is worried that "something burst" in his head due to his headache being so severe. His sister asks that we remove the bandage and monitor his blood pressure during.  Right groin bandage was removed. Right groin incision soft without active bleeding or hematoma. BP prior to bandage removal: 132/83 mmHg BP following bandage removal: 117/77 mmHg  Discussed case with Dr. Estanislado Pandy who also saw patient. Informed patient, per Dr. Estanislado Pandy, that he can go home with the following instructions: No bending, stooping, or lifting anything above 10 pounds for 2 weeks. Stay hydrated by drinking plenty of fluids.  All questions answered and concerns addressed. Patient conveys understanding and agrees with plan.  Bea Graff Kody Brandl, PA-C 04/23/2018, 11:05 AM

## 2018-04-23 NOTE — Progress Notes (Signed)
Dressing removed.  Puncture site clean and dry.  Slight bruising at site.

## 2018-04-29 ENCOUNTER — Ambulatory Visit (HOSPITAL_COMMUNITY)
Admission: RE | Admit: 2018-04-29 | Discharge: 2018-04-29 | Disposition: A | Discharge status: Home / Self Care | Payer: 59 | Source: Ambulatory Visit | Attending: Radiology | Admitting: Radiology

## 2018-04-29 ENCOUNTER — Telehealth (HOSPITAL_COMMUNITY): Payer: Self-pay | Admitting: Radiology

## 2018-04-29 DIAGNOSIS — I671 Cerebral aneurysm, nonruptured: Secondary | ICD-10-CM

## 2018-04-29 HISTORY — PX: IR RADIOLOGIST EVAL & MGMT: IMG5224

## 2018-04-29 NOTE — Telephone Encounter (Signed)
Pt called about returning to work. I informed him that he would need to be seen in follow-up before being released back to work. He will come in today at 1 pm for a follow-up. JM

## 2018-04-30 ENCOUNTER — Telehealth: Payer: Self-pay | Admitting: Family Medicine

## 2018-04-30 ENCOUNTER — Ambulatory Visit: Payer: 59

## 2018-04-30 VITALS — BP 110/70

## 2018-04-30 DIAGNOSIS — I10 Essential (primary) hypertension: Secondary | ICD-10-CM

## 2018-04-30 NOTE — Progress Notes (Signed)
Patient came in today for a blood pressure check. Blood pressure reading was 110/70 in right arm while sitting with a normal size cuff. Patient is currently taking 40 mg of lisinopril daily as directed on 04/08/18. Will forward message to Dr.Pickard via telephone encounter for further instruction.

## 2018-04-30 NOTE — Telephone Encounter (Signed)
Blood pressure is okay as long as no dizziness, feeling faitn If he does feel this way, he can take 30mg  of lisinopril

## 2018-04-30 NOTE — Telephone Encounter (Signed)
Spoke with patient and informed him per Dr. Buelah Manis- Blood pressure is good as long as no dizziness of faint feeling. If you start to feel that way you can go down to 30 mg of lisinopril. Patient verbalized understanding.

## 2018-04-30 NOTE — Telephone Encounter (Signed)
Patient came in today for a blood pressure check. Blood pressure reading was 110/70 in right arm while sitting with a normal size cuff. Patient is currently taking 40 mg of lisinopril daily as directed on 04/08/18. Please advise?

## 2018-05-05 ENCOUNTER — Encounter (HOSPITAL_COMMUNITY): Payer: Self-pay | Admitting: Interventional Radiology

## 2018-05-07 ENCOUNTER — Ambulatory Visit (HOSPITAL_COMMUNITY): Admission: RE | Admit: 2018-05-07 | Payer: 59 | Source: Ambulatory Visit

## 2018-05-14 ENCOUNTER — Ambulatory Visit: Payer: 59

## 2018-05-14 VITALS — BP 120/76

## 2018-05-14 DIAGNOSIS — Z0130 Encounter for examination of blood pressure without abnormal findings: Secondary | ICD-10-CM

## 2018-05-14 NOTE — Progress Notes (Signed)
Patient came in today for a blood pressure check. Patient intial blood pressure was 138/76. Allowed patient to sit and I rechecked BP in the left arm with a normal size cuff and got patient's blood pressure at 120/76. Patient's informed me that though blood pressure was good in office today it is usually low at home running around 106/70 and he has some dizziness. Patient inquired if dosing could be decreased. Informed patient to schedule an appointment with Dr. Dennard Schaumann on next week. Patient is scheduled to see Dr. Dennard Schaumann Tuesday 05/18/18 at 1130.

## 2018-05-18 ENCOUNTER — Encounter: Payer: Self-pay | Admitting: Family Medicine

## 2018-05-18 ENCOUNTER — Ambulatory Visit (INDEPENDENT_AMBULATORY_CARE_PROVIDER_SITE_OTHER): Payer: 59 | Admitting: Family Medicine

## 2018-05-18 VITALS — BP 120/70 | HR 88 | Temp 98.2°F | Resp 14 | Ht 68.0 in | Wt 160.0 lb

## 2018-05-18 DIAGNOSIS — R42 Dizziness and giddiness: Secondary | ICD-10-CM

## 2018-05-18 DIAGNOSIS — I10 Essential (primary) hypertension: Secondary | ICD-10-CM | POA: Diagnosis not present

## 2018-05-18 MED ORDER — LISINOPRIL 20 MG PO TABS: 20.0000 mg | ORAL_TABLET | Freq: Every day | ORAL | 3 refills | Status: DC

## 2018-05-18 NOTE — Progress Notes (Signed)
Subjective:    Patient ID: Jacob Yoder, male    DOB: 11/04/1955, 62 y.o.   MRN: [id number]  HPI  03/25/18 Patient has recently been seen in the emergency room on 2 separate occasions for symptoms that he attributes to a TIA.  He states that his entire mouth will become numb.  His tongue will become numb.  His jaw will become numb the inside of his mouth suggesting 7th or 9th cranial nerve involvement.  He will also develop balance abnormalities and feel dizzy as though he is going to fall.  He will also have symptoms where he will be unable to focus in his vision will become blurry.  This all occurs roughly simultaneously.  It can last minutes to hours and then resolve spontaneously.  To me this suggest posterior circulation TIA.  He went to the emergency room.  Head CT was negative.  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  CTA of the head neck confirmed 5 to 6 mm saccular aneurysm.  It showed no stenosis in the vertebrobasilar system.  CTA NECK FINDINGS  Aortic arch: Atherosclerotic calcification. Three vessel branching. No dilatation or dissection.  Right carotid system: Vessels are smooth and diffusely patent. No noted atheromatous changes.  Left carotid system: Vessels are smooth and diffusely patent. Mild atherosclerotic plaque at the ICA bulb and common carotid origin.  Vertebral arteries: Left subclavian origin atherosclerosis. No subclavian stenosis. The left vertebral artery is strongly dominant. When accounting for streak artifactand mild deformity by osteophytes the vertebral arteries are smooth and diffusely patent.  Skeleton: Diffuse cervical disc degeneration with high-grade narrowing from C3-4 to C6-7. No acute or aggressive finding.  Other neck: No acute finding.  Upper chest: No acute finding. Few tiny nodular densities in the right upper lobe are clustered, suggesting inflammatory process.  Review of the MIP images  confirms the above findings  CTA HEAD FINDINGS  Anterior circulation: Overall mild atherosclerotic plaque on the carotid siphons. Hypoplastic right A1 segment. There is a superior and anteriorly directed right MCA bifurcation aneurysm with pointed appearance at the apex. The aneurysm has a broad neck. In maximal span the sac measures 6 mm. See 3D reformats.  2 mm outpouching from the right supraclinoid ICA. The origin of the posterior communicating artery arises from or near the sac.  Posterior circulation: Tiny right vertebral artery with minimal contribution to the basilar. Proximal basilar fenestration. No branch occlusion or beading. Negative for aneurysm.  Venous sinuses: Patent  Anatomic variants: As above  Delayed phase: No abnormal intracranial enhancement.  Review of the MIP images confirms the above findings  IMPRESSION: 1. 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 The sac is pointed. No acute hemorrhage. 2. 2 mm right posterior communicating aneurysm or infundibulum. 3. Overall mild atherosclerosis.  No flow limiting stenosis.   Patient was taking aspirin 81 mg a day.  He was taking simvastatin 20 mg a day.  He is smoking.  His blood pressure is elevated today 160/92.  No changes were made in his medication at the hospital.  They recommended that he see interventional radiology for treatment of his aneurysm.  However I do not believe the middle cerebral artery aneurysm has any effect on the symptoms he is been having.  I believe this is a coincidental finding.  At that time, my plan was: I have recommended a referral to interventional radiology to discuss treatment of his saccular aneurysm.  In the meantime, I recommended controlling his blood pressure more aggressively and start him on lisinopril 20 mg a day also recommended smoking cessation.  Given his TIA like episodes, I have recommended discontinuation of aspirin as he was taking 81 mg aspirin  prior to the attacks and I have recommended switching to Plavix 75 mg a day.  I recommended discontinuation of simvastatin and replacing it with Crestor 40 mg a day.  I have also recommended smoking cessation.  Differential diagnosis includes TIA, panic attack, vertebrobasilar insufficiency, demyelinating diseases such as MS.  Therefore I would like a second opinion with neurology but I will institute treatment changes to prevent TIA.  I have also recommended an echocardiogram of the heart to evaluate for cardioembolic sources of stroke.  However symptoms sound consistent with microvascular phenomenon and therefore I recommended smoking cessation, Plavix, and Crestor  05/18/18 Patient has been coming by the office several times to have his blood pressure checked.  His systolic blood pressure during these visits is average between 110 and 120/60-70.  He states that occasionally at work, he will become extremely lightheaded.  The dizziness is worse upon standing.  He denies any syncope, chest pain, shortness of breath, dyspnea on exertion.  However he works in extremely hot environment and he is constantly sweating.  He believes his blood pressure is dropping due to dehydration.  He is currently taking lisinopril 40 mg a day Past Medical History:  Diagnosis Date  . DDD (degenerative disc disease), lumbar   . HOH (hard of hearing)    both hearin is diminished  . Hyperlipidemia   . Hypertension   . Smoker   . Stroke [facility])    came in may 20-21 for sx of tia  . Tinnitus    work related   Past Surgical History:  Procedure Laterality Date  . COLONOSCOPY W/ POLYPECTOMY    . HAND SURGERY     right; ring finger amputated  . IR 3D INDEPENDENT WKST  04/21/2018  . IR ANGIO INTRA EXTRACRAN SEL COM CAROTID INNOMINATE UNI L MOD SED  04/21/2018  . IR ANGIO INTRA EXTRACRAN SEL INTERNAL CAROTID UNI R MOD SED  04/21/2018  . IR ANGIO VERTEBRAL SEL VERTEBRAL UNI L MOD SED  04/21/2018  . IR ANGIOGRAM FOLLOW UP STUDY   04/21/2018  . IR ANGIOGRAM FOLLOW UP STUDY  04/21/2018  . IR ANGIOGRAM FOLLOW UP STUDY  04/21/2018  . IR NEURO EACH ADD'L AFTER BASIC UNI RIGHT (MS)  04/21/2018  . IR RADIOLOGIST EVAL & MGMT  03/30/2018  . IR RADIOLOGIST EVAL & MGMT  04/29/2018  . IR TRANSCATH/EMBOLIZ  04/21/2018  . PYLOROPLASTY     back in the 1980's  . RADIOLOGY WITH ANESTHESIA N/A 04/21/2018   Procedure: EMBOLIZATION;  Surgeon: Luanne Bras, MD;  Location: [place];  Service: Radiology;  Laterality: N/A;  . TONSILLECTOMY    . WISDOM TOOTH EXTRACTION     Current Outpatient Medications on File Prior to Visit  Medication Sig Dispense Refill  . acetaminophen (TYLENOL) 650 MG CR tablet Take 1,300 mg by mouth every evening.    Marland Kitchen aspirin EC 325 MG tablet Take 1 tablet (325 mg total) by mouth daily. 30 tablet 0  . clopidogrel (PLAVIX) 75 MG tablet Take 1 tablet (75 mg total) by mouth daily. 90 tablet 3  . Multiple Vitamin (MULTIVITAMIN) tablet Take 1 tablet by mouth daily.    . rosuvastatin (CRESTOR) 40 MG tablet Take 1 tablet (40 mg total) by mouth  daily. 90 tablet 3  . vitamin B-12 (CYANOCOBALAMIN) 500 MCG tablet Take 500 mcg by mouth daily.     No current facility-administered medications on file prior to visit.    No Known Allergies Social History   Socioeconomic History  . Marital status: Divorced    Spouse name: Not on file  . Number of children: Not on file  . Years of education: Not on file  . Highest education level: Not on file  Occupational History  . Not on file  Social Needs  . Financial resource strain: Not on file  . Food insecurity:    Worry: Not on file    Inability: Not on file  . Transportation needs:    Medical: Not on file    Non-medical: Not on file  Tobacco Use  . Smoking status: Current Every Day Smoker    Packs/day: 0.50    Years: 35.00    Pack years: 17.50    Types: Cigarettes  . Smokeless tobacco: Never Used  Substance and Sexual Activity  . Alcohol use: Yes    Alcohol/week: 4.8  oz    Types: 8 Cans of beer per week    Comment: weekends only  . Drug use: No  . Sexual activity: Not on file  Lifestyle  . Physical activity:    Days per week: Not on file    Minutes per session: Not on file  . Stress: Not on file  Relationships  . Social connections:    Talks on phone: Not on file    Gets together: Not on file    Attends religious service: Not on file    Active member of club or organization: Not on file    Attends meetings of clubs or organizations: Not on file    Relationship status: Not on file  . Intimate partner violence:    Fear of current or ex partner: Not on file    Emotionally abused: Not on file    Physically abused: Not on file    Forced sexual activity: Not on file  Other Topics Concern  . Not on file  Social History Narrative  . Not on file      Review of Systems  All other systems reviewed and are negative.      Objective:   Physical Exam  Constitutional: He is oriented to person, place, and time.  Cardiovascular: Normal rate, regular rhythm and normal heart sounds. Exam reveals no gallop and no friction rub.  No murmur heard. Pulmonary/Chest: Effort normal and breath sounds normal. No stridor. No respiratory distress. He has no wheezes. He has no rales.  Neurological: He is alert and oriented to person, place, and time. He displays normal reflexes. No cranial nerve deficit or sensory deficit. He exhibits normal muscle tone. Coordination normal.  Vitals reviewed.         Assessment & Plan:  Benign essential HTN - Plan: lisinopril (PRINIVIL,ZESTRIL) 20 MG tablet  Orthostatic dizziness  Decrease lisinopril to 20 mg a day and monitor blood pressure closely at home.  I encouraged the patient to buy home blood pressure cuff.  Goal blood pressures between 120-140/80-90.  Recheck immediately if symptoms continue despite having normal blood pressures at home.  Also encouraged the patient to check his blood pressure when he feels  lightheaded or dizzy.  He also complains about low back pain unrelieved by Tylenol due to his degenerative joint disease.  Previously he would take NSAIDs but he cannot do this because aspirin  and Plavix.  Tylenol is not touching the pain.  We discussed try muscle relaxers.  Patient is interested in epidural steroid injections but I explained to him that he cannot receive these while he is taking dual antiplatelet therapy.  Offered him muscle relaxers in the meantime and he declined

## 2018-05-26 ENCOUNTER — Other Ambulatory Visit: Payer: Self-pay | Admitting: Family Medicine

## 2018-05-26 ENCOUNTER — Ambulatory Visit: Payer: Self-pay | Admitting: Diagnostic Neuroimaging

## 2018-05-26 ENCOUNTER — Encounter

## 2018-05-26 DIAGNOSIS — I10 Essential (primary) hypertension: Secondary | ICD-10-CM

## 2018-05-26 MED ORDER — LISINOPRIL 20 MG PO TABS: 20.0000 mg | ORAL_TABLET | Freq: Every day | ORAL | 0 refills | Status: DC

## 2018-06-07 ENCOUNTER — Telehealth: Payer: Self-pay | Admitting: Family Medicine

## 2018-06-07 DIAGNOSIS — I10 Essential (primary) hypertension: Secondary | ICD-10-CM

## 2018-06-07 MED ORDER — LISINOPRIL 20 MG PO TABS: 20.0000 mg | ORAL_TABLET | Freq: Every day | ORAL | 1 refills | Status: AC

## 2018-06-07 MED ORDER — ROSUVASTATIN CALCIUM 40 MG PO TABS: 40.0000 mg | ORAL_TABLET | Freq: Every day | ORAL | 1 refills | Status: AC

## 2018-06-07 MED ORDER — CLOPIDOGREL BISULFATE 75 MG PO TABS: 75.0000 mg | ORAL_TABLET | Freq: Every day | ORAL | 1 refills | Status: AC

## 2018-06-07 NOTE — Telephone Encounter (Signed)
Medication called/sent to requested pharmacy for 6 month supply - not sure if they will do that but sent it in for 6 months.

## 2018-06-07 NOTE — Telephone Encounter (Signed)
Pt came in to office needs [place] to call in 6 mth supply on all medications that he is on, he will be losing everything his house cars health insurance and would like for [place] to [facility]. If you have any ?'s please feel free to call him.

## 2018-07-20 ENCOUNTER — Ambulatory Visit: Payer: 59 | Admitting: Adult Health

## 2019-06-04 IMAGING — CT CT ANGIO NECK
2 of 8 series · 8 of 36 positions shown · IV contrast (OMNI 350)
Comparison: Head CT from yesterday.  Brain MRI from earlier today.

CLINICAL DATA: Arterial stricture/occlusion, head neck

Patient presented with blurriness for 2 days and perioral numbness.
A brain MRI showed right MCA aneurysm.
EXAM:
CT ANGIOGRAPHY HEAD AND NECK
TECHNIQUE: Multidetector CT imaging of the head and neck was performed using
the standard protocol during bolus administration of intravenous
contrast. Multiplanar CT image reconstructions and MIPs were
obtained to evaluate the vascular anatomy. Carotid stenosis
measurements (when applicable) are obtained utilizing NASCET
criteria, using the distal internal carotid diameter as the
denominator.
CONTRAST:  50mL R32NNW-QHR IOPAMIDOL (R32NNW-QHR) INJECTION 76%

[Series 7: cta neck axial · axial · 0.39mm/px · z∈[-330,-84]mm · 6 of 346 slices shown]
[im 50/346  soft-tissue]
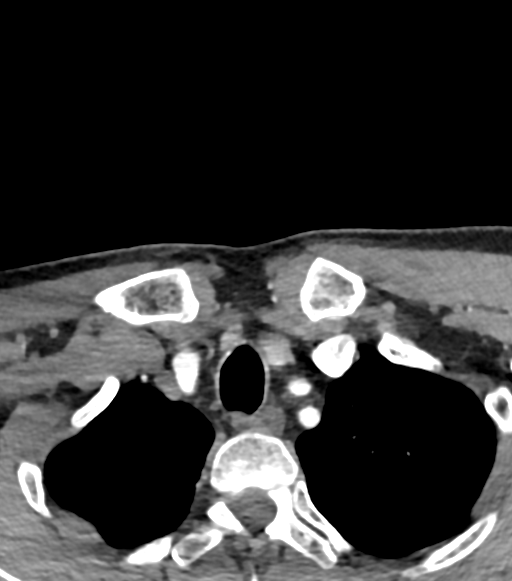
[im 99/346  bone]
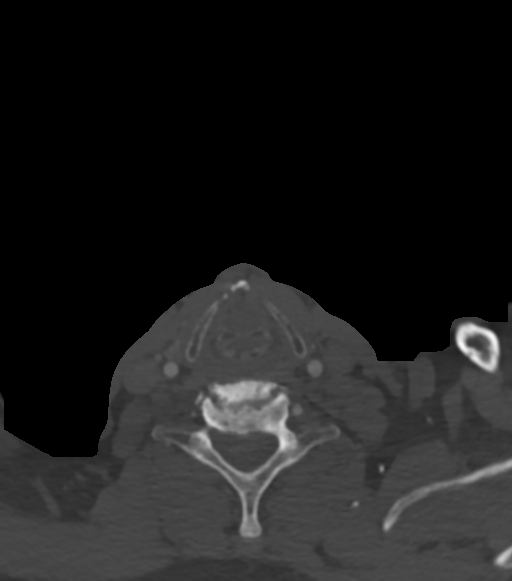
[im 148/346  soft-tissue]
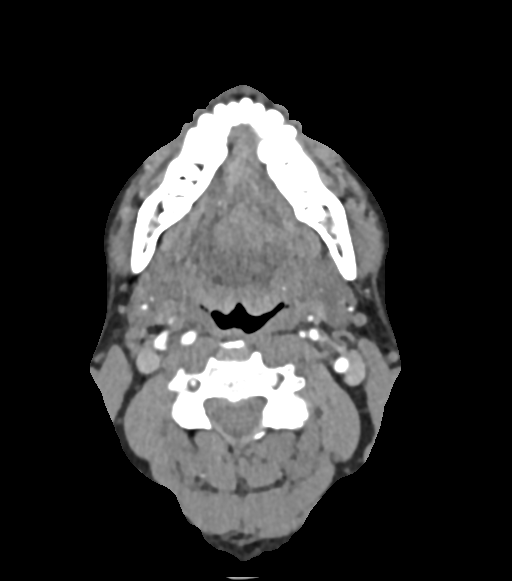
[im 198/346  bone]
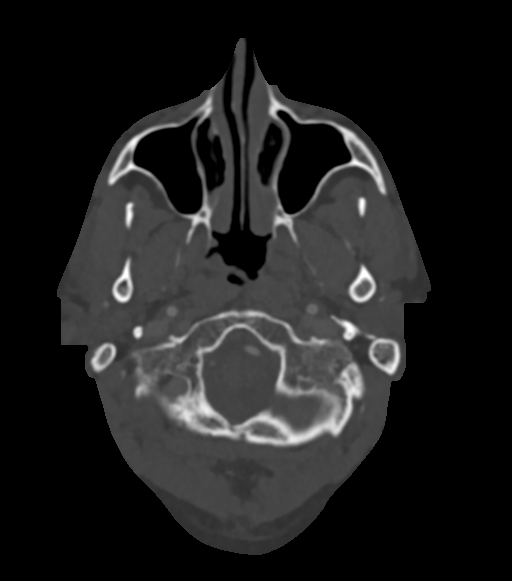
[im 247/346  soft-tissue]
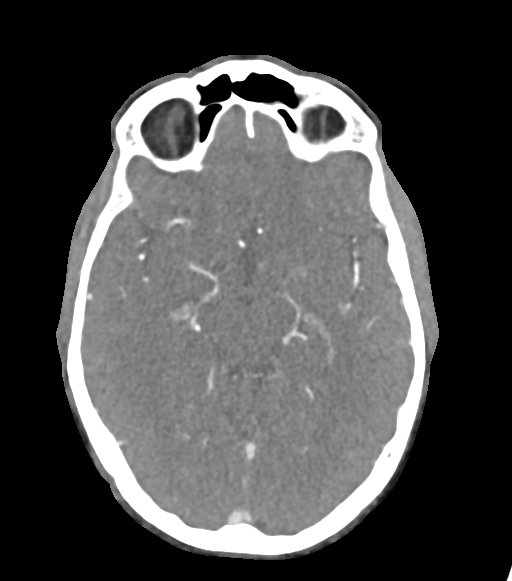
[im 296/346  bone]
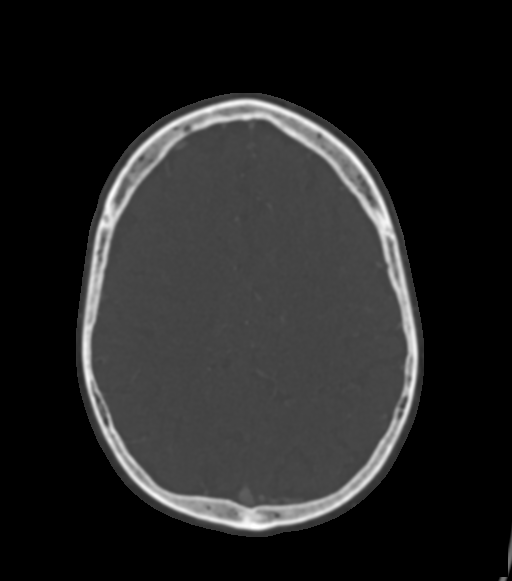

[Series 9: cta neck sagittal · sagittal · 0.45mm/px · 2 of 201 slices shown]
[im 15/201  soft-tissue]
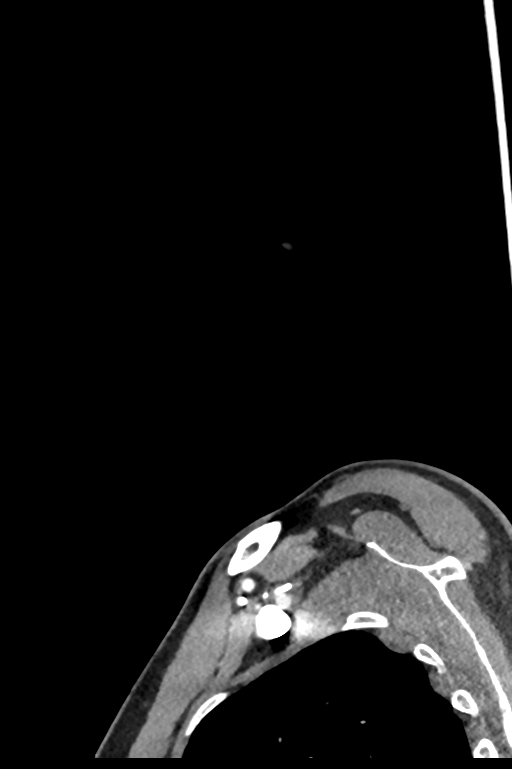
[im 187/201  soft-tissue]
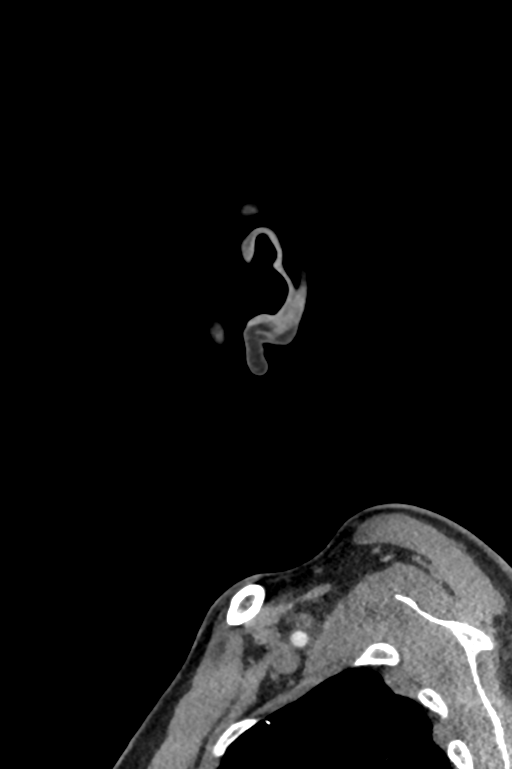

[8 of 36 positions shown; findings below may reference images not displayed]

FINDINGS: CTA NECK FINDINGS

Aortic arch: Atherosclerotic calcification. Three vessel branching.
No dilatation or dissection.

Right carotid system: Vessels are smooth and diffusely patent. No
noted atheromatous changes.

Left carotid system: Vessels are smooth and diffusely patent. Mild
atherosclerotic plaque at the ICA bulb and common carotid origin.

Vertebral arteries: Left subclavian origin atherosclerosis. No
subclavian stenosis. The left vertebral artery is strongly dominant.
When accounting for streak artifactand mild deformity by osteophytes
the vertebral arteries are smooth and diffusely patent.

Skeleton: Diffuse cervical disc degeneration with high-grade
narrowing from C3-4 to C6-7. No acute or aggressive finding.

Other neck: No acute finding.

Upper chest: No acute finding. Few tiny nodular densities in the
right upper lobe are clustered, suggesting inflammatory process.

Review of the MIP images confirms the above findings

CTA HEAD FINDINGS

Anterior circulation: Overall mild atherosclerotic plaque on the
carotid siphons. Hypoplastic right A1 segment. There is a superior
and anteriorly directed right MCA bifurcation aneurysm with pointed
appearance at the apex. The aneurysm has a broad neck. In maximal
span the sac measures 6 mm. See 3D reformats.

2 mm outpouching from the right supraclinoid ICA. The origin of the
posterior communicating artery arises from or near the sac.

Posterior circulation: Tiny right vertebral artery with minimal
contribution to the basilar. Proximal basilar fenestration. No
branch occlusion or beading. Negative for aneurysm.

Venous sinuses: Patent

Anatomic variants: As above

Delayed phase: No abnormal intracranial enhancement.

Review of the MIP images confirms the above findings
IMPRESSION: 1. 6 mm right MCA bifurcation aneurysm with broad neck. The sac is
pointed. No acute hemorrhage.
2. 2 mm right posterior communicating aneurysm or infundibulum.
3. Overall mild atherosclerosis.  No flow limiting stenosis.

## 2019-06-04 IMAGING — MR MR HEAD W/O CM
9 of 10 series · 35 of 48 positions shown · non-contrast
Comparison: CT head 03/22/2018.

CLINICAL DATA: Blurry vision 2 days ago.  Perioral numbness.

EXAM:
MRI HEAD WITHOUT CONTRAST
TECHNIQUE: Multiplanar, multiecho pulse sequences of the brain and surrounding
structures were obtained without intravenous contrast.

[Series 3: DWI · axial · 3.0mm · 1.09mm/px · z∈[-57,+74]mm · 8 of 90 slices shown (1 of 4)]
[im 1/90]
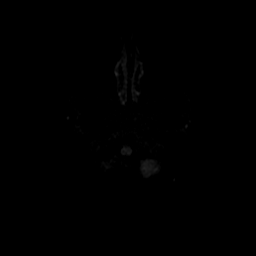
[im 13/90]
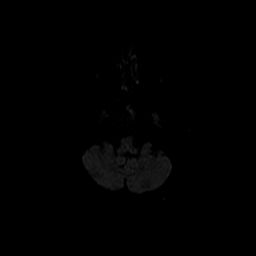
[im 26/90]
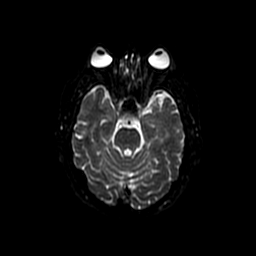
[im 39/90]
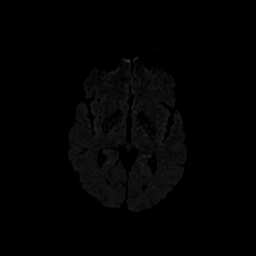
[im 51/90]
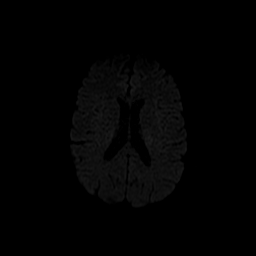
[im 64/90]
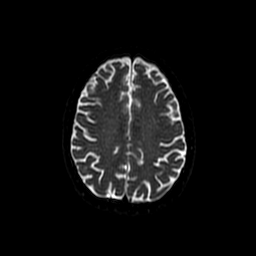
[im 77/90]
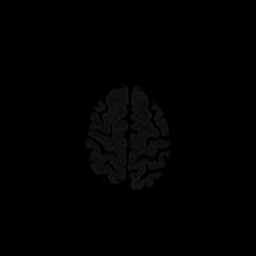
[im 90/90]
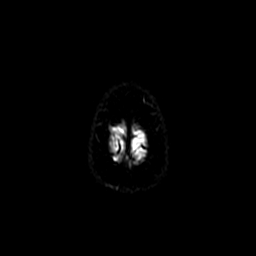

[Series 4: DWI · coronal · 5.0mm · 1.09mm/px · 6 of 72 slices shown (2 of 4)]
[im 1/72]
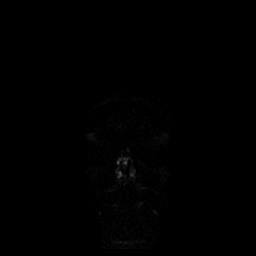
[im 15/72]
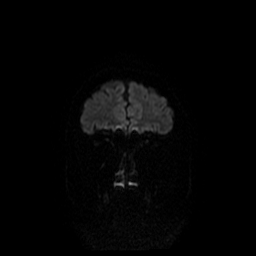
[im 29/72]
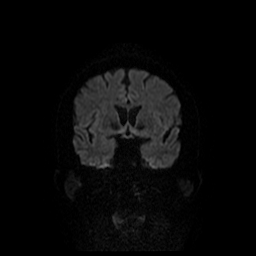
[im 43/72]
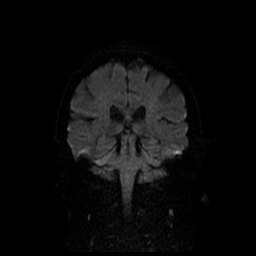
[im 57/72]
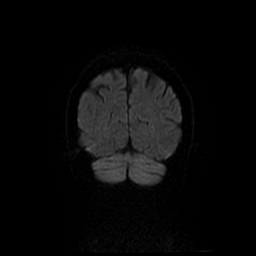
[im 72/72]
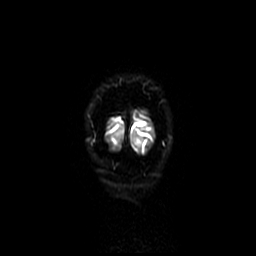

[Series 5: T1 · sagittal · 5.0mm · 0.47mm/px · 2 of 23 slices shown]
[im 1/23]
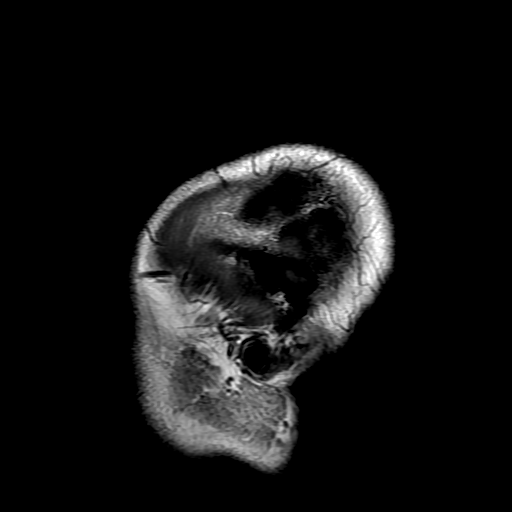
[im 23/23]
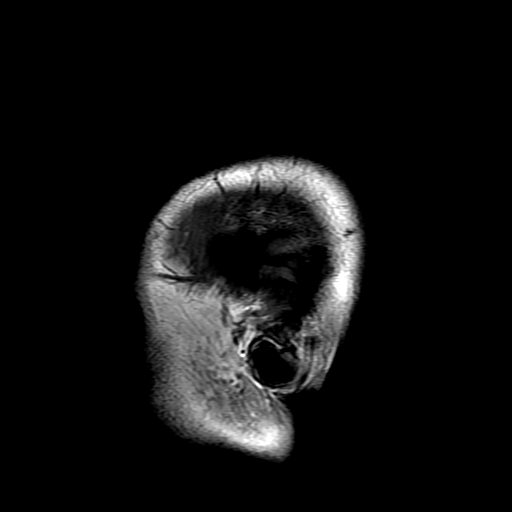

[Series 6: T2 · axial · 5.0mm · 0.43mm/px · z∈[-72,+72]mm · 3 of 25 slices shown (1 of 2)]
[im 1/25]
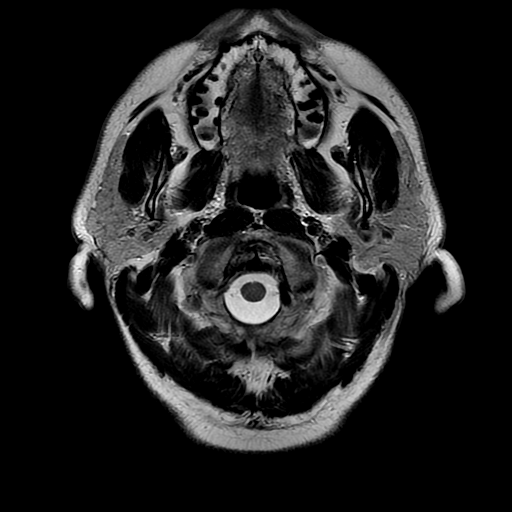
[im 13/25]
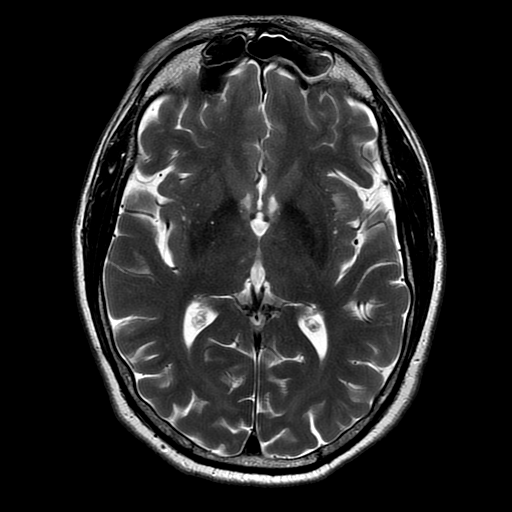
[im 25/25]
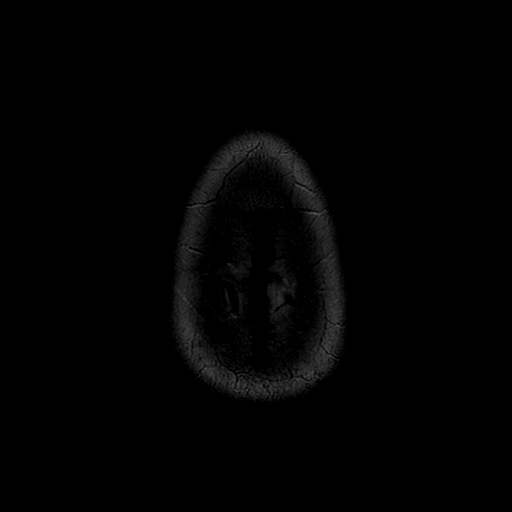

[Series 7: FLAIR · axial · 3.0mm · 0.43mm/px · z∈[-72,+72]mm · 3 of 25 slices shown]
[im 1/25]
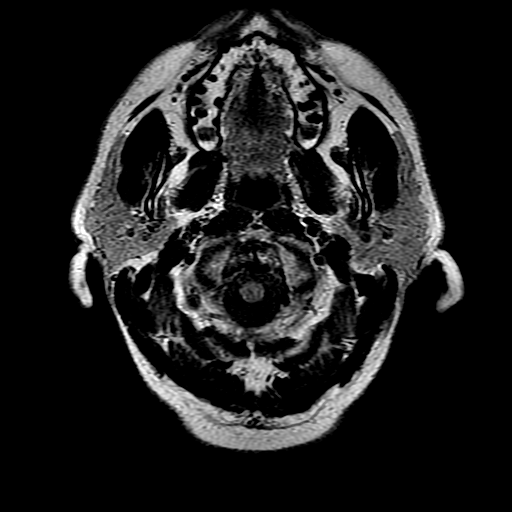
[im 13/25]
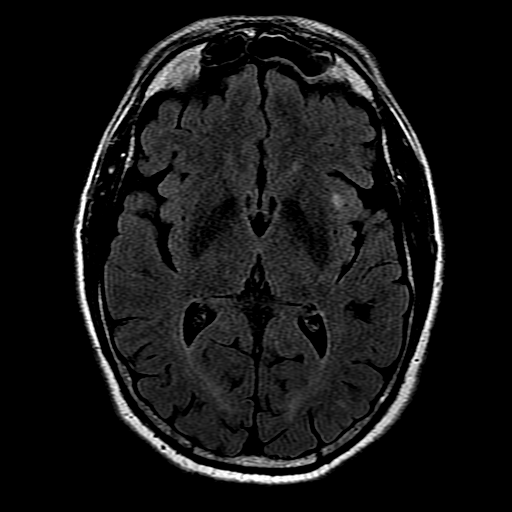
[im 25/25]
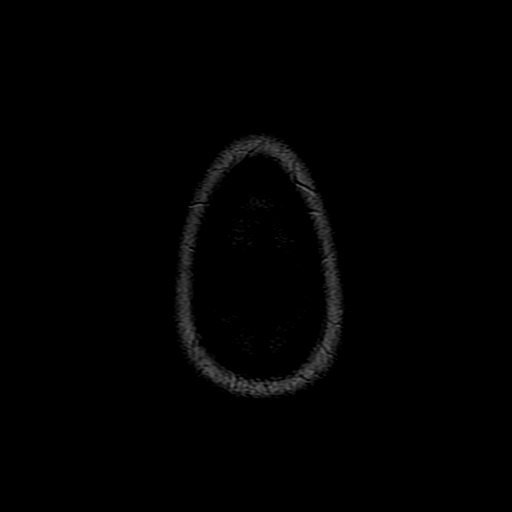

[Series 8: ax mpgr · axial · 5.0mm · 0.43mm/px · 1 of 25 slices shown]
[im 1/25]
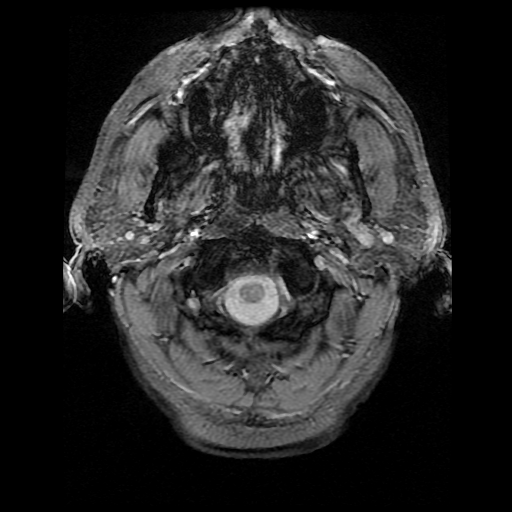

[Series 10: T2 · coronal · 5.0mm · 0.39mm/px · 3 of 28 slices shown (2 of 2)]
[im 1/28]
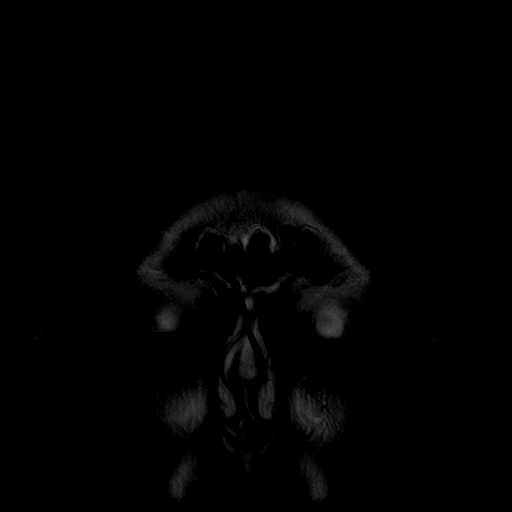
[im 14/28]
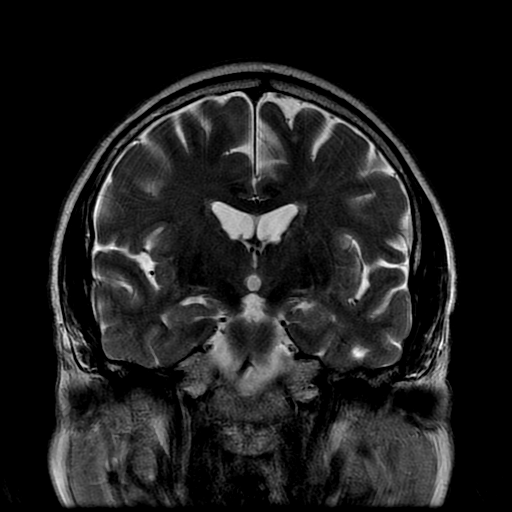
[im 28/28]
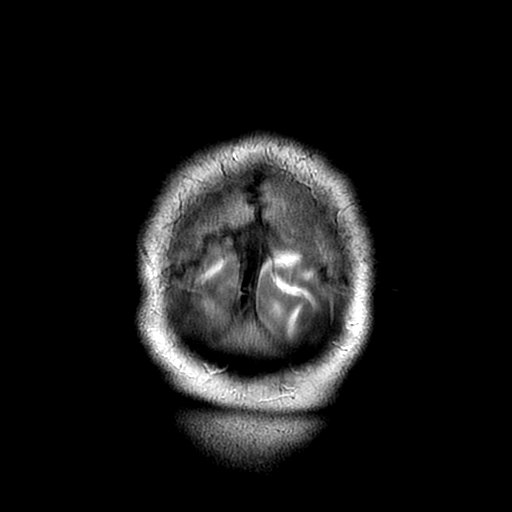

[Series 300: DWI · axial · 3.0mm · 1.09mm/px · z∈[-57,+74]mm · 5 of 45 slices shown (3 of 4)]
[im 1/45]
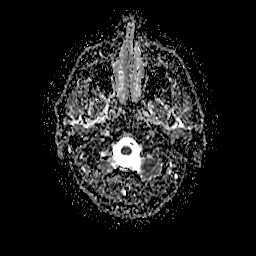
[im 12/45]
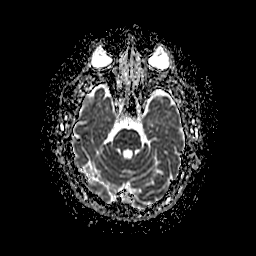
[im 23/45]
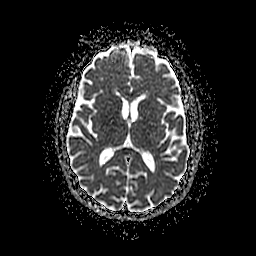
[im 34/45]
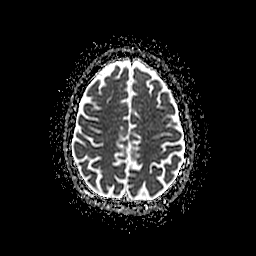
[im 45/45]
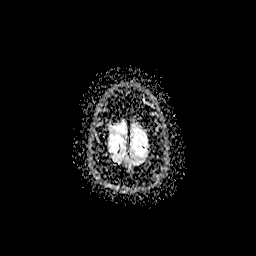

[Series 400: DWI · coronal · 5.0mm · 1.09mm/px · 4 of 36 slices shown (4 of 4)]
[im 1/36]
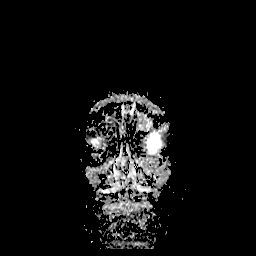
[im 12/36]
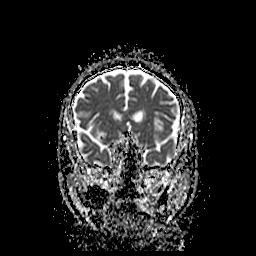
[im 24/36]
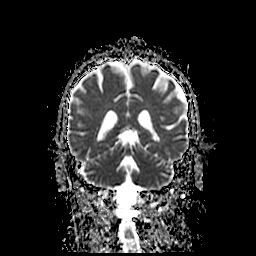
[im 36/36]
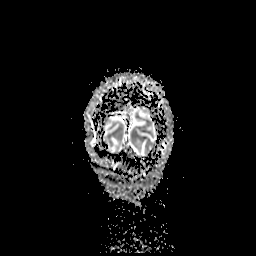

[35 of 48 positions shown; findings below may reference images not displayed]

FINDINGS: Brain: No acute infarction, hemorrhage, hydrocephalus, extra-axial
collection or mass lesion. Borderline atrophy. T2 and FLAIR
hyperintensities throughout the periventricular and subcortical
white matter, likely chronic microvascular ischemic change.

Vascular: Flow voids are maintained. There is bulbous enlargement at
the RIGHT MCA trifurcation/bifurcation, 5 x 6 mm, potentially
representing a saccular aneurysm. No similar findings elsewhere.

Skull and upper cervical spine: Normal marrow signal.

Sinuses/Orbits: No layering fluid. BILATERAL ethmoid mucosal
thickening. Negative orbits.

Other: Trace LEFT mastoid fluid, non worrisome.
IMPRESSION: Borderline atrophy.  Mild chronic microvascular ischemic change.

No acute stroke, visible hemorrhage, or mass lesion.

Bulbous enlargement at the RIGHT MCA bifurcation/trifurcation, could
represent a saccular aneurysm. CTA head neck recommended for further
evaluation.
